# Patient Record
Sex: Male | Born: 1960 | Race: White | Hispanic: No | Marital: Married | Smoking: Never smoker
Health system: Southern US, Community
[De-identification: ages and names within clinical notes are randomized; demographics above are authoritative.]

## PROBLEM LIST (undated history)

## (undated) DIAGNOSIS — N200 Calculus of kidney: Secondary | ICD-10-CM

## (undated) DIAGNOSIS — I341 Nonrheumatic mitral (valve) prolapse: Secondary | ICD-10-CM

## (undated) DIAGNOSIS — K5790 Diverticulosis of intestine, part unspecified, without perforation or abscess without bleeding: Secondary | ICD-10-CM

## (undated) DIAGNOSIS — F419 Anxiety disorder, unspecified: Secondary | ICD-10-CM

## (undated) DIAGNOSIS — I10 Essential (primary) hypertension: Secondary | ICD-10-CM

## (undated) HISTORY — DX: Anxiety disorder, unspecified: F41.9

## (undated) HISTORY — DX: Essential (primary) hypertension: I10

## (undated) HISTORY — PX: COLONOSCOPY: SHX174

## (undated) HISTORY — DX: Diverticulosis of intestine, part unspecified, without perforation or abscess without bleeding: K57.90

---

## 1996-01-25 HISTORY — PX: KNEE ARTHROSCOPY: SUR90

## 1997-08-17 ENCOUNTER — Emergency Department (HOSPITAL_COMMUNITY): Admission: EM | Admit: 1997-08-17 | Discharge: 1997-08-17 | Payer: Self-pay | Admitting: Emergency Medicine

## 1997-08-19 ENCOUNTER — Emergency Department (HOSPITAL_COMMUNITY): Admission: EM | Admit: 1997-08-19 | Discharge: 1997-08-19 | Payer: Self-pay | Admitting: Emergency Medicine

## 1998-03-02 ENCOUNTER — Observation Stay (HOSPITAL_COMMUNITY): Admission: EM | Admit: 1998-03-02 | Discharge: 1998-03-03 | Payer: Self-pay | Admitting: Emergency Medicine

## 1998-03-02 ENCOUNTER — Encounter: Payer: Self-pay | Admitting: General Surgery

## 1999-07-01 ENCOUNTER — Ambulatory Visit (HOSPITAL_COMMUNITY): Admission: RE | Admit: 1999-07-01 | Discharge: 1999-07-01 | Payer: Self-pay | Admitting: *Deleted

## 2001-10-23 ENCOUNTER — Ambulatory Visit (HOSPITAL_BASED_OUTPATIENT_CLINIC_OR_DEPARTMENT_OTHER): Admission: RE | Admit: 2001-10-23 | Discharge: 2001-10-23 | Payer: Self-pay | Admitting: Orthopaedic Surgery

## 2003-05-23 ENCOUNTER — Emergency Department (HOSPITAL_COMMUNITY): Admission: EM | Admit: 2003-05-23 | Discharge: 2003-05-23 | Payer: Self-pay | Admitting: Emergency Medicine

## 2003-11-19 ENCOUNTER — Encounter: Admission: RE | Admit: 2003-11-19 | Discharge: 2003-11-19 | Payer: Self-pay | Admitting: Cardiology

## 2004-03-03 ENCOUNTER — Encounter: Admission: RE | Admit: 2004-03-03 | Discharge: 2004-03-03 | Payer: Self-pay | Admitting: Cardiology

## 2005-04-24 ENCOUNTER — Emergency Department (HOSPITAL_COMMUNITY): Admission: EM | Admit: 2005-04-24 | Discharge: 2005-04-24 | Payer: Self-pay | Admitting: Emergency Medicine

## 2006-06-02 ENCOUNTER — Emergency Department: Payer: Self-pay | Admitting: Emergency Medicine

## 2007-04-02 ENCOUNTER — Emergency Department (HOSPITAL_COMMUNITY): Admission: EM | Admit: 2007-04-02 | Discharge: 2007-04-02 | Payer: Self-pay | Admitting: Emergency Medicine

## 2007-04-08 ENCOUNTER — Observation Stay (HOSPITAL_COMMUNITY): Admission: EM | Admit: 2007-04-08 | Discharge: 2007-04-09 | Payer: Self-pay | Admitting: Emergency Medicine

## 2007-04-12 ENCOUNTER — Emergency Department (HOSPITAL_COMMUNITY): Admission: EM | Admit: 2007-04-12 | Discharge: 2007-04-12 | Payer: Self-pay | Admitting: Emergency Medicine

## 2007-06-30 ENCOUNTER — Emergency Department (HOSPITAL_COMMUNITY): Admission: EM | Admit: 2007-06-30 | Discharge: 2007-06-30 | Payer: Self-pay | Admitting: Emergency Medicine

## 2009-03-23 ENCOUNTER — Emergency Department (HOSPITAL_COMMUNITY): Admission: EM | Admit: 2009-03-23 | Discharge: 2009-03-23 | Payer: Self-pay | Admitting: Emergency Medicine

## 2010-04-14 LAB — URINALYSIS, ROUTINE W REFLEX MICROSCOPIC
Leukocytes, UA: NEGATIVE
Nitrite: NEGATIVE
pH: 5.5 (ref 5.0–8.0)

## 2010-04-14 LAB — URINE MICROSCOPIC-ADD ON

## 2010-06-08 NOTE — H&P (Signed)
NAME:  Matthew Grant, Matthew Grant              ACCOUNT NO.:  [id number]   MEDICAL RECORD NO.:  [id number]          PATIENT TYPE:  OBV   LOCATION:  1536                         FACILITY:  [facility]   PHYSICIAN:  Sigmund I. 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  The patient passed his  ureteral calculus, and saw Dr. Wanda Plump in the office on Wednesday,  with conservative therapy for his right ureteral stone.   The patient awoke this morning at 2:00 a.m. with nausea, vomiting, right  lower quadrant pain, and right flank pain, and right testicular pain.  He had no gross hematuria.  No fever, no chills.  He was seen in [facility] emergency room today, for continued ureteral colic, with CT showing  a 3-mm right UV junction stone.  He also has hydronephrosis.   FAMILY HISTORY:  Noncontributory.   SOCIAL HISTORY:  The patient lives at home with his wife.  He has one  son.   ALLERGIES:  DRUG ALLERGIES ARE NONE KNOWN.   MEDICATIONS:  None.   SOCIAL HISTORY:  Tobacco none.  Alcohol none.   CONSTITUTIONAL/REVIEW OF SYSTEMS:  Significant for general malaise, as  well as acute onset of nausea, vomiting, right lower quadrant pain as  noted above.   ADMISSION PHYSICAL EXAM:  VITAL SIGNS:  Shows a well-developed, well-  nourished white male in mild distress (post pain medication) in the  emergency room.  Blood pressure 139/89, respiratory rate 18, pulse of  66, temperature 99.3.  NECK:  Supple, nontender, no nodes.  CHEST:  Clear to P&A.  ABDOMEN:  Soft, decreased bowel sounds without organomegaly or masses.  There is pain in the right lower quadrant to palpation, and pain in the  right flank to percussion and palpation.   Has pain in the right  testicle.  The GU examination shows normal circumcised penis.  The glans  is normal.  The meatus is normal.  The testicles are descended  bilaterally.  The right and left testicle both measured  4 x 4 cm and nontender.  The epididymis is normal, and the mass is  normal bilaterally.  There is no evidence of infection.  The scrotum  itself is normal.  EXTREMITIES:  No cyanosis, no edema.  PSYCHOLOGIC:  Normal orientation to time, person, and place.   IMPRESSION:  A 3-mm right UV junction stone.  The patient is tired, and  exhausted from trying to pass a stone.  He is in agreement to a 23-hour  observation to see if he can pass a stone overnight.  He may need basket  extraction by Dr. Wanda Plump tomorrow.      Sigmund I. Patsi Sears, M.D.  Electronically Signed     SIT/MEDQ  D:  04/08/2007  T:  04/08/2007  Job:  [id number]   cc:   [place] Service, M.D.  Fax: [phone]   Oley Balm. Georgina Pillion, M.D.  Fax: [phone]

## 2010-06-08 NOTE — Op Note (Signed)
NAME:  Matthew Grant, Matthew Grant              ACCOUNT NO.:  [id number]   MEDICAL RECORD NO.:  [id number]          PATIENT TYPE:  OBV   LOCATION:  1536                         FACILITY:  [facility]   PHYSICIAN:  Boston Service, M.D.DATE OF BIRTH:  04-10-1960   DATE OF PROCEDURE:  04/08/2007  DATE OF DISCHARGE:                               OPERATIVE REPORT   PREOPERATIVE DIAGNOSIS:  A 50 year old male, intractable right  costovertebral angle tenderness, 3-mm stone right UVJ.  All attempts at  conservative therapy insufficient.   POSTOPERATIVE DIAGNOSIS:  A 50 year old male, intractable right  costovertebral angle tenderness, 3-mm stone right UVJ.  All attempts at  conservative therapy insufficient.   PROCEDURE:  Cystoscopy, right and left retrogrades, right ureteroscopy,  stone manipulation.   ANESTHESIA:  General.   DRAINS:  None.   COMPLICATIONS:  None.   DESCRIPTION OF PROCEDURE:  The patient was prepped and draped in the  dorsal lithotomy position after institution of an adequate level of  general anesthesia.  Well-lubricated panendoscope gently inserted at the  urethral meatus.  Normal urethra and sphincter, nonobstructive prostate.  Bullous edema of RUO.  Normal anatomy of LUO.   Retrogrades were performed with a blocking catheter.  Normal course and  caliber of the ureter, pelvis, and calyces on the left.  Hydroureter on  the right with a 3-mm filling defect.  Guidewire was negotiated beyond  the stone.  Ureteral catheter was used for dilation.  Ureteroscope was  inserted alongside the guidewire.  Stone was easily identified and  negotiated into the flat wire basket and then gently withdrawn.  Ureteroscope was reinserted.  No additional stony fragments identified  within the ureter.  Retrograde showed small air bubble in the proximal  ureter but otherwise normal anatomy.  Ureteral catheter was withdrawn.  Bladder was drained.  The patient was given Toradol and B&O suppository  and  then returned to recovery in satisfactory condition.           ______________________________  [place] Service, M.D.     RH/MEDQ  D:  04/09/2007  T:  04/09/2007  Job:  [id number]   cc:   Oley Balm. Georgina Pillion, M.D.  Fax: [phone]

## 2010-06-11 NOTE — Op Note (Signed)
NAME:  ARAS, ALBARRAN                        ACCOUNT NO.:  [id number]   MEDICAL RECORD NO.:  [id number]                   PATIENT TYPE:  AMB   LOCATION:  DSC                                  FACILITY:  [facility]   PHYSICIAN:  Lubertha Basque. Jerl Santos, M.D.             DATE OF BIRTH:  1960-08-09   DATE OF PROCEDURE:  10/23/2001  DATE OF DISCHARGE:                                 OPERATIVE REPORT   PREOPERATIVE DIAGNOSIS:  Left knee torn medial meniscus.   POSTOPERATIVE DIAGNOSES:  1. Left knee torn medial meniscus.  2. Left knee chondromalacia of the patella.   PROCEDURES:  1. Left knee partial medial meniscectomy.  2. Left knee chondroplasty of patellofemoral joint.   ANESTHESIA:  Knee block, MAC.   SURGEON:  Lubertha Basque. Jerl Santos, M.D.   ASSISTANT:  Prince Rome, P.A.   INDICATION FOR PROCEDURE:  The patient is a 50 year old man with seven or  eight months of left knee pain and swelling.  This has persisted despite  oral anti-inflammatories and activity restriction.  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  The procedure was discussed with the patient,  and informed operative consent was obtained after discussion of the possible  complications of, reaction to anesthesia, and infection.   DESCRIPTION OF PROCEDURE:  The patient was taken to the operating suite,  where a knee block was applied along with MAC.  He was positioned supine and  prepped and draped in the normal sterile fashion.  After the administration  of preop IV antibiotics, an arthroscopy of the left knee was performed  through two inferior portals.  The suprapatellar pouch was benign, while the  patellofemoral joint exhibited some mild grade 3 change on the medial  patellar facet, far medial.  A thorough chondroplasty was done, but the  kneecap tracked very well.  In the medial compartment he did have a flap  tear of the posterior horn  of the medial meniscus, addressed with about a  20% partial medial meniscectomy.  He had no degenerative changes in the  compartment.  The lateral compartment was completely benign with no evidence  of meniscal or articular cartilage injury.  He had an intact ACL and PCL.  The knee was thoroughly irrigated at the end of the case, followed by  placement of Marcaine with epinephrine and morphine.  Adaptic was placed  over the two portals, followed by dry gauze and a loose Ace wrap.  Estimated  blood loss and intraoperative fluids can be obtained from anesthesia  records.   DISPOSITION:  The patient was taken to the recovery room in stable  condition.  Plans were for him to go home the same day and to follow up in  the office in less than a week.  I will contact him by phone tonight.  Lubertha Basque Jerl Santos, M.D.   PGD/MEDQ  D:  10/23/2001  T:  10/23/2001  Job:  [id number]

## 2010-06-11 NOTE — Cardiovascular Report (Signed)
[place]  Patient:    Matthew Grant, Matthew Grant                     MRN: [id number] Proc. Date: 07/01/99 Adm. Date:  [id number] Disc. Date: [id number] Attending:  Meade Maw A                        Cardiac Catheterization  INDICATIONS FOR PROCEDURE:  Continual chest pain with redistribution in the inferior apical region on a Cardiolite.  REFERRING PHYSICIAN:  Oley Balm. Georgina Pillion, M.D.  DESCRIPTION OF PROCEDURE:  After obtaining written informed consent, the patient was brought to the cardiac catheterization lab in the postabsorptive state.  Preop sedation was achieved with IV Versed.  The right femoral head was identified using radiographic technique.  Local anesthesia was achieved using 1% Xylocaine.  A 6 French hemostasis sheath was placed into the right femoral artery using a modified Seldinger technique.  Selective coronary angiography was performed using a JL4, JR4 Judkins catheter.  All catheter exchanges were made over a guidewire.  The hemostasis sheath was flushed after each injection.  FINDINGS:  The aorta pressure was 112/62, LV pressure was 112/14.   Single plane ventriculogram revealed normal wall motion with an ejection fraction of 65%.  There was no mitral regurgitation noted.  CORONARY ANGIOGRAPHY:  Left main coronary artery:  The left main coronary artery bifurcated into the left anterior descending and circumflex vessel. There is no significant disease in the left main coronary artery.  Left anterior descending:  The left anterior descending gave rise to a small diagonal #1, moderate sized diagonal #2, diagonal #3 and ended as an apical recurrent branch.  There is no significant disease in the left anterior descending.  Circumflex vessel:  The circumflex was codominant for the posterior circumflex and gave rise to a small OM-1, large bifurcating OM-2 and ended as a PDA AV groove vessel.  There was no significant disease in the  circumflex or its branches.  Right coronary artery:  The right coronary artery was codominant and gave rise to the PDA and PL branch as well as the conus branch.  There was no significant disease in the right coronary artery.  IMPRESSION: 1. Normal coronaries. 2. Normal systolic function.  RECOMMENDATIONS:  To consider other etiologies for his chest pain. DD:  07/01/99 TD:  07/05/99 Job: [id number]

## 2010-10-18 LAB — BASIC METABOLIC PANEL
BUN: 11
CO2: 28
Calcium: 8.7
Creatinine, Ser: 1.65 — ABNORMAL HIGH
GFR calc Af Amer: >60
GFR calc Af Amer: >60
GFR calc non Af Amer: >60
GFR calc non Af Amer: 45 — ABNORMAL LOW
Glucose, Bld: 96
Potassium: 4
Sodium: 141
Sodium: 136
Sodium: 136

## 2010-10-18 LAB — CBC
HCT: 42.5
HCT: 37.4 — ABNORMAL LOW
HCT: 40.1
Hemoglobin: 14.1
MCHC: 34.8
MCHC: 35
MCV: 82.9
RBC: 4.51
RBC: 4.85
RDW: 14
RDW: 14.2
WBC: 6.6
WBC: 8.4

## 2010-10-18 LAB — DIFFERENTIAL
Basophils Absolute: 0
Basophils Relative: 1
Eosinophils Relative: 1
Eosinophils Relative: 1
Lymphocytes Relative: 32
Lymphocytes Relative: 16
Lymphs Abs: 1.4
Monocytes Absolute: 0.6
Neutro Abs: 3.7

## 2010-10-18 LAB — URINE MICROSCOPIC-ADD ON

## 2010-10-18 LAB — URINALYSIS, ROUTINE W REFLEX MICROSCOPIC
Bilirubin Urine: NEGATIVE
Glucose, UA: NEGATIVE
Ketones, ur: NEGATIVE
Leukocytes, UA: NEGATIVE
Leukocytes, UA: NEGATIVE
Leukocytes, UA: NEGATIVE
Specific Gravity, Urine: 1.021
Specific Gravity, Urine: 1.035 — ABNORMAL HIGH
Urobilinogen, UA: 0.2
pH: 5
pH: 8

## 2010-10-21 LAB — URINE MICROSCOPIC-ADD ON

## 2010-10-21 LAB — BASIC METABOLIC PANEL
CO2: 25
GFR calc Af Amer: >60
GFR calc non Af Amer: >60
Glucose, Bld: 103 — ABNORMAL HIGH
Potassium: 3.7
Sodium: 140

## 2010-10-21 LAB — URINALYSIS, ROUTINE W REFLEX MICROSCOPIC
Glucose, UA: NEGATIVE
Ketones, ur: NEGATIVE
Nitrite: NEGATIVE
Specific Gravity, Urine: 1.035 — ABNORMAL HIGH
pH: 5.5

## 2010-10-21 LAB — DIFFERENTIAL
Basophils Absolute: 0
Eosinophils Relative: 2
Lymphocytes Relative: 33
Monocytes Absolute: 0.3
Monocytes Relative: 6

## 2010-10-21 LAB — CBC
HCT: 41
Hemoglobin: 14.1
RBC: 4.88
RDW: 13.9

## 2010-10-21 LAB — URINE CULTURE: Culture: NO GROWTH

## 2011-04-09 ENCOUNTER — Emergency Department (INDEPENDENT_AMBULATORY_CARE_PROVIDER_SITE_OTHER)
Admission: EM | Admit: 2011-04-09 | Discharge: 2011-04-09 | Disposition: A | Discharge status: Home / Self Care | Payer: 59 | Source: Home / Self Care | Attending: Family Medicine | Admitting: Family Medicine

## 2011-04-09 ENCOUNTER — Encounter (HOSPITAL_COMMUNITY): Payer: Self-pay | Admitting: Emergency Medicine

## 2011-04-09 DIAGNOSIS — K6281 Anal sphincter tear (healed) (nontraumatic) (old): Secondary | ICD-10-CM

## 2011-04-09 DIAGNOSIS — S31831A Laceration without foreign body of anus, initial encounter: Secondary | ICD-10-CM

## 2011-04-09 HISTORY — DX: Nonrheumatic mitral (valve) prolapse: I34.1

## 2011-04-09 HISTORY — DX: Calculus of kidney: N20.0

## 2011-04-09 MED ORDER — STARCH 51 % RE SUPP: 1.0000 | Freq: Two times a day (BID) | RECTAL | Status: AC

## 2011-04-09 NOTE — ED Notes (Signed)
Onset one month ago of blood in stool.  Noticed blood on paper, now seeing blood in toilet.

## 2011-04-09 NOTE — ED Provider Notes (Signed)
History     CSN: [id number]  Arrival date & time 04/09/11  1126   First MD Initiated Contact with Patient 04/09/11 1132      Chief Complaint  Patient presents with  . Rectal Bleeding    (Consider location/radiation/quality/duration/timing/severity/associated sxs/prior treatment) Patient is a 51 y.o. male presenting with hematochezia. The history is provided by the patient.  Rectal Bleeding  The current episode started more than 2 weeks ago. The onset was gradual. The problem has been gradually worsening. The pain is mild. The stool is described as streaked with blood. There was no prior successful therapy. There was no prior unsuccessful therapy. Associated symptoms include rectal pain. Pertinent negatives include no abdominal pain, no diarrhea, no hemorrhoids, no nausea and no vomiting. Associated symptoms comments: Anxious b/o fam hx colon and prostate cancer..    Past Medical History  Diagnosis Date  . Mitral valve prolapse   . Kidney calculi     History reviewed. No pertinent past surgical history.  Family History  Problem Relation Age of Onset  . Cancer Mother   . Hypertension Mother   . Cancer Father   . Hypertension Father     History  Substance Use Topics  . Smoking status: Never Smoker   . Smokeless tobacco: Not on file  . Alcohol Use: No      Review of Systems  Constitutional: Negative.   Gastrointestinal: Positive for blood in stool, hematochezia, anal bleeding and rectal pain. Negative for nausea, vomiting, abdominal pain, diarrhea, constipation and hemorrhoids.    Allergies  Iohexol  Home Medications  No current outpatient prescriptions on file.  BP 198/118  Pulse 71  Temp(Src) 98.4 F (36.9 C) (Oral)  Resp 18  SpO2 97%  Physical Exam  Nursing note and vitals reviewed. Constitutional: He is oriented to person, place, and time. He appears well-developed and well-nourished.  Abdominal: Soft. Bowel sounds are normal. He exhibits no  distension and no mass. There is no tenderness. There is no rebound and no guarding.       Superficial inferior anal tear , acutely bleeding, sl tender to palpation, no hemorrhoids., prostate neg.  Neurological: He is alert and oriented to person, place, and time.    ED Course  Procedures (including critical care time)  Labs Reviewed - No data to display No results found.   1. Anal tear       MDM          Linna Hoff, MD 04/09/11 [phone]

## 2011-04-09 NOTE — Discharge Instructions (Signed)
Use medicine as prescribed and see dr Arlyce Dice for colonoscopy.

## 2011-08-19 ENCOUNTER — Encounter: Payer: Self-pay | Admitting: Internal Medicine

## 2011-09-20 ENCOUNTER — Encounter: Payer: Self-pay | Admitting: Internal Medicine

## 2011-10-07 ENCOUNTER — Ambulatory Visit (AMBULATORY_SURGERY_CENTER): Payer: 59 | Admitting: *Deleted

## 2011-10-07 VITALS — Ht 76.0 in | Wt 259.0 lb

## 2011-10-07 DIAGNOSIS — Z1211 Encounter for screening for malignant neoplasm of colon: Secondary | ICD-10-CM

## 2011-10-07 MED ORDER — NA SULFATE-K SULFATE-MG SULF 17.5-3.13-1.6 GM/177ML PO SOLN: ORAL | Status: DC

## 2011-10-10 ENCOUNTER — Encounter: Payer: Self-pay | Admitting: Internal Medicine

## 2011-10-14 ENCOUNTER — Encounter: Payer: Self-pay | Admitting: Internal Medicine

## 2011-10-14 ENCOUNTER — Ambulatory Visit (AMBULATORY_SURGERY_CENTER): Payer: 59 | Admitting: Internal Medicine

## 2011-10-14 VITALS — BP 141/91 | HR 65 | Temp 97.6°F | Resp 17 | Ht 76.0 in | Wt 259.0 lb

## 2011-10-14 DIAGNOSIS — D126 Benign neoplasm of colon, unspecified: Secondary | ICD-10-CM

## 2011-10-14 DIAGNOSIS — Z1211 Encounter for screening for malignant neoplasm of colon: Secondary | ICD-10-CM

## 2011-10-14 DIAGNOSIS — K635 Polyp of colon: Secondary | ICD-10-CM

## 2011-10-14 LAB — HM COLONOSCOPY

## 2011-10-14 MED ORDER — SODIUM CHLORIDE 0.9 % IV SOLN: 500.0000 mL | INTRAVENOUS | Status: DC

## 2011-10-14 NOTE — Patient Instructions (Addendum)
YOU HAD AN ENDOSCOPIC PROCEDURE TODAY AT THE [facility]: Refer to the procedure report that was given to you for any specific questions about what was found during the examination.  If the procedure report does not answer your questions, please call your gastroenterologist to clarify.  If you requested that your care partner not be given the details of your procedure findings, then the procedure report has been included in a sealed envelope for you to review at your convenience later.  YOU SHOULD EXPECT: Some feelings of bloating in the abdomen. Passage of more gas than usual.  Walking can help get rid of the air that was put into your GI tract during the procedure and reduce the bloating. If you had a lower endoscopy (such as a colonoscopy or flexible sigmoidoscopy) you may notice spotting of blood in your stool or on the toilet paper. If you underwent a bowel prep for your procedure, then you may not have a normal bowel movement for a few days.  DIET: Your first meal following the procedure should be a light meal and then it is ok to progress to your normal diet.  A half-sandwich or bowl of soup is an example of a good first meal.  Heavy or fried foods are harder to digest and may make you feel nauseous or bloated.  Likewise meals heavy in dairy and vegetables can cause extra gas to form and this can also increase the bloating.  Drink plenty of fluids but you should avoid alcoholic beverages for 24 hours.  ACTIVITY: Your care partner should take you home directly after the procedure.  You should plan to take it easy, moving slowly for the rest of the day.  You can resume normal activity the day after the procedure however you should NOT DRIVE or use heavy machinery for 24 hours (because of the sedation medicines used during the test).    SYMPTOMS TO REPORT IMMEDIATELY: A gastroenterologist can be reached at any hour.  During normal business hours, 8:30 AM to 5:00 PM Monday through Friday,  call [phone].  After hours and on weekends, please call the GI answering service at [phone] who will take a message and have the physician on call contact you.   Following lower endoscopy (colonoscopy or flexible sigmoidoscopy):  Excessive amounts of blood in the stool  Significant tenderness or worsening of abdominal pains                                      Do not use aspirin nor                                                                                                                                               NSAIDS for two weeks.  Swelling  of the abdomen that is new, acute  Fever of 100F or higher  FOLLOW UP: If any biopsies were taken you will be contacted by phone or by letter within the next 1-3 weeks.  Call your gastroenterologist if you have not heard about the biopsies in 3 weeks.  Our staff will call the home number listed on your records the next business day following your procedure to check on you and address any questions or concerns that you may have at that time regarding the information given to you following your procedure. This is a courtesy call and so if there is no answer at the home number and we have not heard from you through the emergency physician on call, we will assume that you have returned to your regular daily activities without incident.  SIGNATURES/CONFIDENTIALITY: You and/or your care partner have signed paperwork which will be entered into your electronic medical record.  These signatures attest to the fact that that the information above on your After Visit Summary has been reviewed and is understood.  Full responsibility of the confidentiality of this discharge information lies with you and/or your care-partner.   Thank-you for choosing [place] for your healthcare needs.

## 2011-10-14 NOTE — Op Note (Signed)
[facility] [address] [place], [address]   COLONOSCOPY PROCEDURE REPORT  PATIENT: Matthew, Grant  MR#: [id number] BIRTHDATE: [birth year] , 51  yrs. old GENDER: Male ENDOSCOPIST: Beverley Fiedler, MD REFERRED XB:JYNWG, Bernadene Bell. PROCEDURE DATE:  10/14/2011 PROCEDURE:   Colonoscopy with snare polypectomy ASA CLASS:   Class II INDICATIONS:average risk screening and first colonoscopy. MEDICATIONS: MAC sedation, administered by CRNA and Propofol (Diprivan) 340 mg IV  DESCRIPTION OF PROCEDURE:   After the risks benefits and alternatives of the procedure were thoroughly explained, informed consent was obtained.  A digital rectal exam revealed no rectal mass.   The LB CF-[id number]  endoscope was introduced through the anus and advanced to the cecum, which was identified by both the appendix and ileocecal valve. No adverse events experienced. The quality of the prep was Suprep good  The instrument was then slowly withdrawn as the colon was fully examined.   COLON FINDINGS: Mild diverticulosis was noted in the descending colon and sigmoid colon.   Three sessile polyps measuring 5-12 mm in size were found in the descending colon (6 mm), sigmoid colon (5 mm), and rectosigmoid colon (12 mm).  Polypectomy was performed using hot snare (1) and using cold snare (2).  All resections were complete and all polyp tissue was completely retrieved. Retroflexed views revealed internal hemorrhoids. The time to cecum=10 minutes 33 seconds.  Withdrawal time=14 minutes 51 seconds.  The scope was withdrawn and the procedure completed. COMPLICATIONS: There were no complications.  ENDOSCOPIC IMPRESSION: 1.   Mild diverticulosis was noted in the descending colon and sigmoid colon 2.   Three sessile polyps measuring 5-12 mm in size were found in the descending colon, sigmoid colon, and rectosigmoid colon; Polypectomy was performed using hot snare and using cold  snare  RECOMMENDATIONS: 1.  Hold aspirin, aspirin products, and anti-inflammatory medication for 2 weeks. 2.  High fiber diet 3.  If the polyps removed today are proven to be adenomatous (pre-cancerous) polyps, you will need a colonoscopy in 3 years. Otherwise you should continue to follow colorectal cancer screening guidelines for "routine risk" patients with a colonoscopy in 10 years.  You will receive a letter within 1-2 weeks with the results of your biopsy as well as final recommendations.  Please call my office if you have not received a letter after 3 weeks.  eSigned:  Beverley Fiedler, MD 10/14/2011 10:09 AM  cc: Etta Grandchild, MD and The Patient   PATIENT NAME:  Matthew, Grant MR#: [id number]

## 2011-10-14 NOTE — Progress Notes (Addendum)
Patient did not have preoperative order for IV antibiotic SSI prophylaxis. (G8918)  Patient did not experience any of the following events: a burn prior to discharge; a fall within the facility; wrong site/side/patient/procedure/implant event; or a hospital transfer or hospital admission upon discharge from the facility. (G8907)  

## 2011-10-17 ENCOUNTER — Telehealth: Payer: Self-pay | Admitting: *Deleted

## 2011-10-17 NOTE — Telephone Encounter (Signed)
NO ANSWER, MESSAGE LEFT FOR THE PATIENT. 

## 2011-10-18 ENCOUNTER — Encounter: Payer: Self-pay | Admitting: Internal Medicine

## 2011-10-31 ENCOUNTER — Encounter: Payer: Self-pay | Admitting: Internal Medicine

## 2011-10-31 ENCOUNTER — Other Ambulatory Visit (INDEPENDENT_AMBULATORY_CARE_PROVIDER_SITE_OTHER): Payer: 59

## 2011-10-31 ENCOUNTER — Ambulatory Visit (INDEPENDENT_AMBULATORY_CARE_PROVIDER_SITE_OTHER): Payer: 59 | Admitting: Internal Medicine

## 2011-10-31 VITALS — BP 118/88 | HR 59 | Temp 98.3°F | Resp 16 | Ht 76.0 in | Wt 256.2 lb

## 2011-10-31 DIAGNOSIS — Z0000 Encounter for general adult medical examination without abnormal findings: Secondary | ICD-10-CM

## 2011-10-31 DIAGNOSIS — Z23 Encounter for immunization: Secondary | ICD-10-CM

## 2011-10-31 LAB — CBC WITH DIFFERENTIAL/PLATELET
Basophils Absolute: 0.1 10*3/uL (ref 0.0–0.1)
Eosinophils Absolute: 0.2 10*3/uL (ref 0.0–0.7)
Hemoglobin: 14.8 g/dL (ref 13.0–17.0)
Lymphocytes Relative: 30.7 % (ref 12.0–46.0)
Lymphs Abs: 2 10*3/uL (ref 0.7–4.0)
MCHC: 33.8 g/dL (ref 30.0–36.0)
Monocytes Relative: 5.2 % (ref 3.0–12.0)
Neutro Abs: 3.9 10*3/uL (ref 1.4–7.7)
Platelets: 223 10*3/uL (ref 150.0–400.0)
RDW: 14.3 % (ref 11.5–14.6)

## 2011-10-31 LAB — COMPREHENSIVE METABOLIC PANEL
ALT: 26 U/L (ref 0–53)
AST: 16 U/L (ref 0–37)
CO2: 30 mEq/L (ref 19–32)
Calcium: 8.9 mg/dL (ref 8.4–10.5)
Chloride: 106 mEq/L (ref 96–112)
Creatinine, Ser: 0.9 mg/dL (ref 0.4–1.5)
GFR: 93.61 mL/min (ref >60.00)
Potassium: 4.6 mEq/L (ref 3.5–5.1)
Sodium: 141 mEq/L (ref 135–145)
Total Protein: 6.6 g/dL (ref 6.0–8.3)

## 2011-10-31 LAB — LIPID PANEL
LDL Cholesterol: 122 mg/dL — ABNORMAL HIGH (ref 0–99)
Total CHOL/HDL Ratio: 4

## 2011-10-31 LAB — TSH: TSH: 0.64 u[IU]/mL (ref 0.35–5.50)

## 2011-10-31 NOTE — Assessment & Plan Note (Signed)
Exam done, vaccines were addressed, labs were ordered, pt ed material was given

## 2011-10-31 NOTE — Progress Notes (Signed)
  Subjective:    Patient ID: Matthew Grant, male    DOB: November 21, 1960, 51 y.o.   MRN: [id number]  HPI  New to me for a physical, he feels well and offers no complaints.  Review of Systems  Constitutional: Negative.   HENT: Negative.   Eyes: Negative.   Respiratory: Negative.   Cardiovascular: Negative.   Gastrointestinal: Negative.   Genitourinary: Negative.   Musculoskeletal: Negative.   Skin: Negative.   Neurological: Negative.   Hematological: Negative.   Psychiatric/Behavioral: Negative.        Objective:   Physical Exam  Vitals reviewed. Constitutional: He is oriented to person, place, and time. He appears well-developed and well-nourished. No distress.  HENT:  Head: Normocephalic and atraumatic.  Mouth/Throat: Oropharynx is clear and moist. No oropharyngeal exudate.  Eyes: Conjunctivae normal are normal. Right eye exhibits no discharge. Left eye exhibits no discharge. No scleral icterus.  Neck: Normal range of motion. Neck supple. No JVD present. No tracheal deviation present. No thyromegaly present.  Cardiovascular: Normal rate, regular rhythm, normal heart sounds and intact distal pulses.  Exam reveals no gallop and no friction rub.   No murmur heard. Pulmonary/Chest: Effort normal and breath sounds normal. No stridor. No respiratory distress. He has no wheezes. He has no rales. He exhibits no tenderness.  Abdominal: Soft. Bowel sounds are normal. He exhibits no distension and no mass. There is no tenderness. There is no rebound and no guarding. Hernia confirmed negative in the right inguinal area and confirmed negative in the left inguinal area.  Genitourinary: Rectum normal, prostate normal, testes normal and penis normal. Rectal exam shows no external hemorrhoid, no internal hemorrhoid, no fissure, no mass, no tenderness and anal tone normal. Guaiac negative stool. Prostate is not enlarged and not tender. Right testis shows no mass, no swelling and no tenderness. Right  testis is descended. Left testis shows no mass, no swelling and no tenderness. Left testis is descended. Circumcised. No penile erythema or penile tenderness. No discharge found.  Musculoskeletal: Normal range of motion. He exhibits no edema and no tenderness.  Lymphadenopathy:    He has no cervical adenopathy.       Right: No inguinal adenopathy present.       Left: No inguinal adenopathy present.  Neurological: He is oriented to person, place, and time.  Skin: Skin is warm and dry. No rash noted. He is not diaphoretic. No erythema. No pallor.  Psychiatric: He has a normal mood and affect. His behavior is normal. Judgment and thought content normal.      Lab Results  Component Value Date   WBC 5.6 06/30/2007   HGB 14.1 06/30/2007   HCT 41.0 06/30/2007   PLT 191 06/30/2007   GLUCOSE 103* 06/30/2007   NA 140 06/30/2007   K 3.7 06/30/2007   CL 105 06/30/2007   CREATININE 0.99 06/30/2007   BUN 16 06/30/2007   CO2 25 06/30/2007      Assessment & Plan:

## 2011-10-31 NOTE — Patient Instructions (Signed)
Health Maintenance, Males A healthy lifestyle and preventative care can promote health and wellness.  Maintain regular health, dental, and eye exams.  Eat a healthy diet. Foods like vegetables, fruits, whole grains, low-fat dairy products, and lean protein foods contain the nutrients you need without too many calories. Decrease your intake of foods high in solid fats, added sugars, and salt. Get information about a proper diet from your caregiver, if necessary.  Regular physical exercise is one of the most important things you can do for your health. Most adults should get at least 150 minutes of moderate-intensity exercise (any activity that increases your heart rate and causes you to sweat) each week. In addition, most adults need muscle-strengthening exercises on 2 or more days a week.   Maintain a healthy weight. The body mass index (BMI) is a screening tool to identify possible weight problems. It provides an estimate of body fat based on height and weight. Your caregiver can help determine your BMI, and can help you achieve or maintain a healthy weight. For adults 20 years and older:  A BMI below 18.5 is considered underweight.  A BMI of 18.5 to 24.9 is normal.  A BMI of 25 to 29.9 is considered overweight.  A BMI of 30 and above is considered obese.  Maintain normal blood lipids and cholesterol by exercising and minimizing your intake of saturated fat. Eat a balanced diet with plenty of fruits and vegetables. Blood tests for lipids and cholesterol should begin at age 20 and be repeated every 5 years. If your lipid or cholesterol levels are high, you are over 50, or you are a high risk for heart disease, you may need your cholesterol levels checked more frequently.Ongoing high lipid and cholesterol levels should be treated with medicines, if diet and exercise are not effective.  If you smoke, find out from your caregiver how to quit. If you do not use tobacco, do not start.  If you  choose to drink alcohol, do not exceed 2 drinks per day. One drink is considered to be 12 ounces (355 mL) of beer, 5 ounces (148 mL) of wine, or 1.5 ounces (44 mL) of liquor.  Avoid use of street drugs. Do not share needles with anyone. Ask for help if you need support or instructions about stopping the use of drugs.  High blood pressure causes heart disease and increases the risk of stroke. Blood pressure should be checked at least every 1 to 2 years. Ongoing high blood pressure should be treated with medicines if weight loss and exercise are not effective.  If you are 45 to 50 years old, ask your caregiver if you should take aspirin to prevent heart disease.  Diabetes screening involves taking a blood sample to check your fasting blood sugar level. This should be done once every 3 years, after age 45, if you are within normal weight and without risk factors for diabetes. Testing should be considered at a younger age or be carried out more frequently if you are overweight and have at least 1 risk factor for diabetes.  Colorectal cancer can be detected and often prevented. Most routine colorectal cancer screening begins at the age of 50 and continues through age 75. However, your caregiver may recommend screening at an earlier age if you have risk factors for colon cancer. On a yearly basis, your caregiver may provide home test kits to check for hidden blood in the stool. Use of a small camera at the end of a tube,   to directly examine the colon (sigmoidoscopy or colonoscopy), can detect the earliest forms of colorectal cancer. Talk to your caregiver about this at age 50, when routine screening begins. Direct examination of the colon should be repeated every 5 to 10 years through age 75, unless early forms of pre-cancerous polyps or small growths are found.  Hepatitis C blood testing is recommended for all people born from 1945 through 1965 and any individual with known risks for hepatitis C.  Healthy  men should no longer receive prostate-specific antigen (PSA) blood tests as part of routine cancer screening. Consult with your caregiver about prostate cancer screening.  Testicular cancer screening is not recommended for adolescents or adult males who have no symptoms. Screening includes self-exam, caregiver exam, and other screening tests. Consult with your caregiver about any symptoms you have or any concerns you have about testicular cancer.  Practice safe sex. Use condoms and avoid high-risk sexual practices to reduce the spread of sexually transmitted infections (STIs).  Use sunscreen with a sun protection factor (SPF) of 30 or greater. Apply sunscreen liberally and repeatedly throughout the day. You should seek shade when your shadow is shorter than you. Protect yourself by wearing long sleeves, pants, a wide-brimmed hat, and sunglasses year round, whenever you are outdoors.  Notify your caregiver of new moles or changes in moles, especially if there is a change in shape or color. Also notify your caregiver if a mole is larger than the size of a pencil eraser.  A one-time screening for abdominal aortic aneurysm (AAA) and surgical repair of large AAAs by sound wave imaging (ultrasonography) is recommended for ages 65 to 75 years who are current or former smokers.  Stay current with your immunizations. Document Released: 07/09/2007 Document Revised: 04/04/2011 Document Reviewed: 06/07/2010 [employer] Patient Information 2013 [employer].  

## 2012-05-22 ENCOUNTER — Ambulatory Visit (INDEPENDENT_AMBULATORY_CARE_PROVIDER_SITE_OTHER): Payer: PRIVATE HEALTH INSURANCE | Admitting: Internal Medicine

## 2012-05-22 ENCOUNTER — Encounter: Payer: Self-pay | Admitting: Internal Medicine

## 2012-05-22 ENCOUNTER — Other Ambulatory Visit (INDEPENDENT_AMBULATORY_CARE_PROVIDER_SITE_OTHER): Payer: PRIVATE HEALTH INSURANCE

## 2012-05-22 VITALS — BP 150/100 | HR 50 | Temp 98.0°F | Resp 16 | Ht 76.0 in | Wt 250.2 lb

## 2012-05-22 DIAGNOSIS — I10 Essential (primary) hypertension: Secondary | ICD-10-CM

## 2012-05-22 DIAGNOSIS — R0789 Other chest pain: Secondary | ICD-10-CM

## 2012-05-22 DIAGNOSIS — R9431 Abnormal electrocardiogram [ECG] [EKG]: Secondary | ICD-10-CM

## 2012-05-22 LAB — BASIC METABOLIC PANEL
Calcium: 8.7 mg/dL (ref 8.4–10.5)
Creatinine, Ser: 1 mg/dL (ref 0.4–1.5)
Sodium: 139 mEq/L (ref 135–145)

## 2012-05-22 LAB — TSH: TSH: 0.72 u[IU]/mL (ref 0.35–5.50)

## 2012-05-22 MED ORDER — AMLODIPINE-OLMESARTAN 5-40 MG PO TABS: 1.0000 | ORAL_TABLET | Freq: Every day | ORAL | Status: DC

## 2012-05-22 NOTE — Patient Instructions (Signed)

## 2012-05-22 NOTE — Progress Notes (Signed)
Subjective:    Patient ID: Matthew Grant, male    DOB: [birth year], 52 y.o.   MRN: [id number]  Chest Pain  This is a recurrent problem. Episode onset: off and on for 2 months. The onset quality is gradual. The problem occurs intermittently. The problem has been unchanged. The pain is present in the lateral region. The pain is at a severity of 2/10. The pain is mild. The quality of the pain is described as tightness. The pain does not radiate. Associated symptoms include headaches. Pertinent negatives include no abdominal pain, back pain, claudication, cough, diaphoresis, dizziness, exertional chest pressure, fever, hemoptysis, irregular heartbeat, leg pain, lower extremity edema, malaise/fatigue, nausea, near-syncope, numbness, orthopnea, palpitations, PND, shortness of breath, sputum production, syncope, vomiting or weakness. The pain is aggravated by nothing. He has tried nothing for the symptoms. The treatment provided no relief.  His past medical history is significant for hypertension.  Pertinent negatives for past medical history include no seizures. Prior diagnostic workup includes cardiac catherization (he tells me that he had a normal cardiac cath about 12 years ago).      Review of Systems  Constitutional: Negative.  Negative for fever, chills, malaise/fatigue, diaphoresis, activity change, appetite change, fatigue and unexpected weight change.  Eyes: Negative.   Respiratory: Negative.  Negative for apnea, cough, hemoptysis, sputum production, choking, chest tightness, shortness of breath, wheezing and stridor.   Cardiovascular: Positive for chest pain. Negative for palpitations, orthopnea, claudication, leg swelling, syncope, PND and near-syncope.  Gastrointestinal: Negative.  Negative for nausea, vomiting, abdominal pain, diarrhea and constipation.  Endocrine: Negative.   Genitourinary: Negative.   Musculoskeletal: Negative.  Negative for myalgias, back pain, joint swelling,  arthralgias and gait problem.  Skin: Negative.  Negative for color change, pallor, rash and wound.  Allergic/Immunologic: Negative.   Neurological: Positive for light-headedness and headaches. Negative for dizziness, tremors, seizures, facial asymmetry, speech difficulty, weakness and numbness.  Hematological: Negative.  Negative for adenopathy. Does not bruise/bleed easily.  Psychiatric/Behavioral: Negative.        Objective:   Physical Exam  Vitals reviewed. Constitutional: He is oriented to person, place, and time. He appears well-developed and well-nourished. No distress.  HENT:  Head: Normocephalic and atraumatic.  Mouth/Throat: Oropharynx is clear and moist. No oropharyngeal exudate.  Eyes: Conjunctivae are normal. Right eye exhibits no discharge. Left eye exhibits no discharge. No scleral icterus.  Neck: Normal range of motion. Neck supple. No JVD present. No tracheal deviation present. No thyromegaly present.  Cardiovascular: Normal rate, regular rhythm, normal heart sounds and intact distal pulses.  Exam reveals no gallop and no friction rub.   No murmur heard. Pulmonary/Chest: Effort normal and breath sounds normal. No stridor. No respiratory distress. He has no wheezes. He has no rales. He exhibits no tenderness.  Abdominal: Soft. Bowel sounds are normal. He exhibits no distension and no mass. There is no tenderness. There is no rebound and no guarding.  Musculoskeletal: Normal range of motion. He exhibits no edema and no tenderness.  Lymphadenopathy:    He has no cervical adenopathy.  Neurological: He is oriented to person, place, and time.  Skin: Skin is warm and dry. No rash noted. He is not diaphoretic. No erythema. No pallor.  Psychiatric: He has a normal mood and affect. His behavior is normal. Judgment and thought content normal.      Lab Results  Component Value Date   WBC 6.5 10/31/2011   HGB 14.8 10/31/2011   HCT 43.6 10/31/2011   PLT  223.0 10/31/2011   GLUCOSE  84 10/31/2011   CHOL 179 10/31/2011   TRIG 77.0 10/31/2011   HDL 42.10 10/31/2011   LDLCALC 122* 10/31/2011   ALT 26 10/31/2011   AST 16 10/31/2011   NA 141 10/31/2011   K 4.6 10/31/2011   CL 106 10/31/2011   CREATININE 0.9 10/31/2011   BUN 14 10/31/2011   CO2 30 10/31/2011   TSH 0.64 10/31/2011   PSA 0.85 10/31/2011      Assessment & Plan:

## 2012-05-23 ENCOUNTER — Telehealth: Payer: Self-pay

## 2012-05-23 DIAGNOSIS — R0789 Other chest pain: Secondary | ICD-10-CM

## 2012-05-23 DIAGNOSIS — R9431 Abnormal electrocardiogram [ECG] [EKG]: Secondary | ICD-10-CM

## 2012-05-23 NOTE — Telephone Encounter (Signed)
Pt advised.

## 2012-05-23 NOTE — Telephone Encounter (Signed)
done

## 2012-05-23 NOTE — Assessment & Plan Note (Signed)
EKG has an old abnormality - nothing acute I will check his troponin and cardiac enzymes

## 2012-05-23 NOTE — Telephone Encounter (Signed)
I have no way to order a test there

## 2012-05-23 NOTE — Assessment & Plan Note (Signed)
Will start Azor to treat the hypertension

## 2012-05-23 NOTE — Telephone Encounter (Signed)
Pt called requesting the results of labs

## 2012-05-23 NOTE — Telephone Encounter (Signed)
All the labs were normal

## 2012-05-23 NOTE — Telephone Encounter (Signed)
Pt is now requesting a referral to Cardiology at [facility]

## 2012-05-23 NOTE — Telephone Encounter (Signed)
Pt called requesting to have stress test order changed to [facility] due to cost savings.

## 2012-05-23 NOTE — Assessment & Plan Note (Addendum)
On the EKG there is loss of voltage and flat T waves in III and aVf (?old inferior MI) Today I will check his cardiac enzymes to see if there has been an acute event I have also ordered a lexiscan to see if there is CAD, old scar

## 2012-05-29 ENCOUNTER — Encounter (HOSPITAL_COMMUNITY): Payer: PRIVATE HEALTH INSURANCE

## 2013-01-24 HISTORY — PX: NECK SURGERY: SHX720

## 2014-02-24 HISTORY — PX: KIDNEY STONE SURGERY: SHX686

## 2014-09-22 ENCOUNTER — Encounter: Payer: Self-pay | Admitting: Internal Medicine

## 2014-12-02 ENCOUNTER — Encounter: Payer: Self-pay | Admitting: Internal Medicine

## 2014-12-25 ENCOUNTER — Ambulatory Visit (AMBULATORY_SURGERY_CENTER): Payer: Self-pay

## 2014-12-25 VITALS — Ht 76.0 in | Wt 260.4 lb

## 2014-12-25 DIAGNOSIS — Z860100 Personal history of colon polyps, unspecified: Secondary | ICD-10-CM

## 2014-12-25 MED ORDER — SUPREP BOWEL PREP KIT 17.5-3.13-1.6 GM/177ML PO SOLN: 1.0000 | Freq: Once | ORAL | Status: DC

## 2014-12-25 NOTE — Progress Notes (Signed)
No allergies to eggs or soy No past problems with anesthesia No diet/weight loss meds No home oxygen  Has email and internet;registered for emmi

## 2014-12-30 ENCOUNTER — Encounter: Payer: Self-pay | Admitting: Internal Medicine

## 2015-01-08 ENCOUNTER — Ambulatory Visit (AMBULATORY_SURGERY_CENTER): Payer: PRIVATE HEALTH INSURANCE | Admitting: Internal Medicine

## 2015-01-08 ENCOUNTER — Encounter: Payer: Self-pay | Admitting: Internal Medicine

## 2015-01-08 VITALS — BP 132/67 | HR 54 | Temp 95.9°F | Resp 22 | Ht 76.0 in | Wt 260.0 lb

## 2015-01-08 DIAGNOSIS — K635 Polyp of colon: Secondary | ICD-10-CM | POA: Diagnosis not present

## 2015-01-08 DIAGNOSIS — K5140 Inflammatory polyps of colon without complications: Secondary | ICD-10-CM | POA: Diagnosis not present

## 2015-01-08 DIAGNOSIS — D125 Benign neoplasm of sigmoid colon: Secondary | ICD-10-CM

## 2015-01-08 DIAGNOSIS — Z86010 Personal history of colonic polyps: Secondary | ICD-10-CM

## 2015-01-08 MED ORDER — SODIUM CHLORIDE 0.9 % IV SOLN: 500.0000 mL | INTRAVENOUS | Status: DC

## 2015-01-08 NOTE — Patient Instructions (Signed)
YOU HAD AN ENDOSCOPIC PROCEDURE TODAY AT THE [facility]:   Refer to the procedure report that was given to you for any specific questions about what was found during the examination.  If the procedure report does not answer your questions, please call your gastroenterologist to clarify.  If you requested that your care partner not be given the details of your procedure findings, then the procedure report has been included in a sealed envelope for you to review at your convenience later.  YOU SHOULD EXPECT: Some feelings of bloating in the abdomen. Passage of more gas than usual.  Walking can help get rid of the air that was put into your GI tract during the procedure and reduce the bloating. If you had a lower endoscopy (such as a colonoscopy or flexible sigmoidoscopy) you may notice spotting of blood in your stool or on the toilet paper. If you underwent a bowel prep for your procedure, you may not have a normal bowel movement for a few days.  Please Note:  You might notice some irritation and congestion in your nose or some drainage.  This is from the oxygen used during your procedure.  There is no need for concern and it should clear up in a day or so.  SYMPTOMS TO REPORT IMMEDIATELY:   Following lower endoscopy (colonoscopy or flexible sigmoidoscopy):  Excessive amounts of blood in the stool  Significant tenderness or worsening of abdominal pains  Swelling of the abdomen that is new, acute  Fever of 100F or higher  For urgent or emergent issues, a gastroenterologist can be reached at any hour by calling [phone].  DIET: Your first meal following the procedure should be a small meal and then it is ok to progress to your normal diet. Heavy or fried foods are harder to digest and may make you feel nauseous or bloated.  Likewise, meals heavy in dairy and vegetables can increase bloating.  Drink plenty of fluids but you should avoid alcoholic beverages for 24 hours.  ACTIVITY:   You should plan to take it easy for the rest of today and you should NOT DRIVE or use heavy machinery until tomorrow (because of the sedation medicines used during the test).    FOLLOW UP: Our staff will call the number listed on your records the next business day following your procedure to check on you and address any questions or concerns that you may have regarding the information given to you following your procedure. If we do not reach you, we will leave a message.  However, if you are feeling well and you are not experiencing any problems, there is no need to return our call.  We will assume that you have returned to your regular daily activities without incident.  If any biopsies were taken you will be contacted by phone or by letter within the next 1-3 weeks.  Please call us at [phone] if you have not heard about the biopsies in 3 weeks.    SIGNATURES/CONFIDENTIALITY: You and/or your care partner have signed paperwork which will be entered into your electronic medical record.  These signatures attest to the fact that that the information above on your After Visit Summary has been reviewed and is understood.  Full responsibility of the confidentiality of this discharge information lies with you and/or your care-partner.  Await pathology  Please read over handouts about polyps, diverticulosis and high fiber diets  Continue your normal medications 

## 2015-01-08 NOTE — Progress Notes (Signed)
To recovery, report to [place], RN, VSS 

## 2015-01-08 NOTE — Progress Notes (Signed)
Called to room to assist during endoscopic procedure.  Patient ID and intended procedure confirmed with present staff. Received instructions for my participation in the procedure from the performing physician.  

## 2015-01-08 NOTE — Op Note (Signed)
[facility]  [address] [place], [id number]   COLONOSCOPY PROCEDURE REPORT  PATIENT: Matthew Grant, Matthew Grant  MR#: [id number] BIRTHDATE: 02-Sep-1960 , 71  yrs. old GENDER: male ENDOSCOPIST: Jerene Bears, MD PROCEDURE DATE:  01/08/2015 PROCEDURE:   Colonoscopy, surveillance and Colonoscopy with snare polypectomy First Screening Colonoscopy - Avg.  risk and is 50 yrs.  old or older - No.  Prior Negative Screening - Now for repeat screening. N/A  History of Adenoma - Now for follow-up colonoscopy & has been > or = to 3 yrs.  Yes hx of adenoma.  Has been 3 or more years since last colonoscopy.  Polyps removed today? Yes ASA CLASS:   Class II INDICATIONS:Surveillance due to prior colonic neoplasia and PH Colon Adenoma. MEDICATIONS: Monitored anesthesia care and Propofol 400 mg IV  DESCRIPTION OF PROCEDURE:   After the risks benefits and alternatives of the procedure were thoroughly explained, informed consent was obtained.  The digital rectal exam revealed no rectal mass.   The LB [id number]  endoscope was introduced through the anus and advanced to the cecum, which was identified by both the appendix and ileocecal valve. No adverse events experienced. The quality of the prep was good.  (Suprep was used)  The instrument was then slowly withdrawn as the colon was fully examined. Estimated blood loss is zero unless otherwise noted in this procedure report.   COLON FINDINGS: The colon was redundant.  Manual abdominal counter-pressure (applied very low in the anterior abd) was used to reach the cecum.   A sessile polyp measuring 6 mm in size was found in the sigmoid colon at the edge of a diverticulum.  A polypectomy was performed with a cold snare, then cold forceps used to ensure complete resection.  The resection was complete, the polyp tissue was completely retrieved and sent to histology.   There was moderate diverticulosis noted in the descending colon and  sigmoid colon.  Retroflexed views revealed no abnormalities. The time to cecum = 8.0 Withdrawal time = 14.8   The scope was withdrawn and the procedure completed. COMPLICATIONS: There were no immediate complications.  ENDOSCOPIC IMPRESSION: 1.   Sessile polyp was found in the sigmoid colon; polypectomy was performed with a cold snare 2.   Moderate diverticulosis was noted in the descending colon and sigmoid colon  RECOMMENDATIONS: 1.  Await pathology results 2.  High fiber diet 3.  Timing of repeat colonoscopy will be determined by pathology findings. 4.  You will receive a letter within 1-2 weeks with the results of your biopsy as well as final recommendations.  Please call my office if you have not received a letter after 3 weeks.  eSigned:  Jerene Bears, MD 01/08/2015 10:00 AM cc: Janith Lima, MD and The Patient

## 2015-01-09 ENCOUNTER — Telehealth: Payer: Self-pay | Admitting: *Deleted

## 2015-01-09 NOTE — Telephone Encounter (Signed)
  Follow up Call-  Call back number 01/08/2015  Post procedure Call Back phone  # [phone]  Permission to leave phone message Yes     Patient questions:  Message left to call us if  Necessary.

## 2015-01-12 LAB — HM COLONOSCOPY

## 2015-01-20 ENCOUNTER — Encounter: Payer: Self-pay | Admitting: Internal Medicine

## 2015-01-20 NOTE — Addendum Note (Signed)
Addended by: Janith Lima on: 01/20/2015 01:48 PM   Modules accepted: Miquel Dunn

## 2015-05-13 ENCOUNTER — Ambulatory Visit: Payer: PRIVATE HEALTH INSURANCE | Admitting: Internal Medicine

## 2015-05-14 ENCOUNTER — Other Ambulatory Visit (INDEPENDENT_AMBULATORY_CARE_PROVIDER_SITE_OTHER): Payer: 59

## 2015-05-14 ENCOUNTER — Encounter: Payer: Self-pay | Admitting: Internal Medicine

## 2015-05-14 ENCOUNTER — Ambulatory Visit (INDEPENDENT_AMBULATORY_CARE_PROVIDER_SITE_OTHER): Payer: 59 | Admitting: Internal Medicine

## 2015-05-14 VITALS — BP 124/78 | HR 63 | Temp 98.0°F | Resp 16 | Ht 76.0 in | Wt 261.0 lb

## 2015-05-14 DIAGNOSIS — H6123 Impacted cerumen, bilateral: Secondary | ICD-10-CM | POA: Diagnosis not present

## 2015-05-14 DIAGNOSIS — R9431 Abnormal electrocardiogram [ECG] [EKG]: Secondary | ICD-10-CM | POA: Diagnosis not present

## 2015-05-14 DIAGNOSIS — Z0000 Encounter for general adult medical examination without abnormal findings: Secondary | ICD-10-CM | POA: Diagnosis not present

## 2015-05-14 DIAGNOSIS — Z0001 Encounter for general adult medical examination with abnormal findings: Secondary | ICD-10-CM

## 2015-05-14 DIAGNOSIS — R0609 Other forms of dyspnea: Secondary | ICD-10-CM | POA: Diagnosis not present

## 2015-05-14 DIAGNOSIS — R0600 Dyspnea, unspecified: Secondary | ICD-10-CM

## 2015-05-14 DIAGNOSIS — I10 Essential (primary) hypertension: Secondary | ICD-10-CM | POA: Diagnosis not present

## 2015-05-14 LAB — COMPREHENSIVE METABOLIC PANEL
ALK PHOS: 76 U/L (ref 39–117)
ALT: 24 U/L (ref 0–53)
AST: 14 U/L (ref 0–37)
Albumin: 4.3 g/dL (ref 3.5–5.2)
BILIRUBIN TOTAL: 0.6 mg/dL (ref 0.2–1.2)
BUN: 17 mg/dL (ref 6–23)
CALCIUM: 9.4 mg/dL (ref 8.4–10.5)
CO2: 30 mEq/L (ref 19–32)
Chloride: 104 mEq/L (ref 96–112)
Creatinine, Ser: 1.06 mg/dL (ref 0.40–1.50)
GFR: 77.42 mL/min (ref >60.00)
Glucose, Bld: 98 mg/dL (ref 70–99)
Potassium: 4.7 mEq/L (ref 3.5–5.1)
Sodium: 140 mEq/L (ref 135–145)
TOTAL PROTEIN: 6.6 g/dL (ref 6.0–8.3)

## 2015-05-14 LAB — CBC WITH DIFFERENTIAL/PLATELET
BASOS ABS: 0 10*3/uL (ref 0.0–0.1)
Basophils Relative: 0.6 % (ref 0.0–3.0)
Eosinophils Absolute: 0.1 10*3/uL (ref 0.0–0.7)
Eosinophils Relative: 1 % (ref 0.0–5.0)
HEMATOCRIT: 45.3 % (ref 39.0–52.0)
Hemoglobin: 15.3 g/dL (ref 13.0–17.0)
LYMPHS ABS: 2.1 10*3/uL (ref 0.7–4.0)
LYMPHS PCT: 29.7 % (ref 12.0–46.0)
MCHC: 33.9 g/dL (ref 30.0–36.0)
MCV: 84.2 fl (ref 78.0–100.0)
MONOS PCT: 5.2 % (ref 3.0–12.0)
Monocytes Absolute: 0.4 10*3/uL (ref 0.1–1.0)
NEUTROS PCT: 63.5 % (ref 43.0–77.0)
Neutro Abs: 4.5 10*3/uL (ref 1.4–7.7)
Platelets: 244 10*3/uL (ref 150.0–400.0)
RBC: 5.38 Mil/uL (ref 4.22–5.81)
RDW: 14 % (ref 11.5–15.5)
WBC: 7.1 10*3/uL (ref 4.0–10.5)

## 2015-05-14 LAB — URINALYSIS, ROUTINE W REFLEX MICROSCOPIC
Bilirubin Urine: NEGATIVE
Hgb urine dipstick: NEGATIVE
KETONES UR: NEGATIVE
LEUKOCYTES UA: NEGATIVE
Nitrite: NEGATIVE
PH: 6 (ref 5.0–8.0)
RBC / HPF: NONE SEEN (ref 0–?)
SPECIFIC GRAVITY, URINE: 1.025 (ref 1.000–1.030)
Total Protein, Urine: NEGATIVE
URINE GLUCOSE: NEGATIVE
UROBILINOGEN UA: 0.2 (ref 0.0–1.0)

## 2015-05-14 LAB — CARDIAC PANEL
CK MB: 0.9 ng/mL (ref 0.3–4.0)
CK TOTAL: 61 U/L (ref 7–232)
RELATIVE INDEX: 1.5 calc (ref 0.0–2.5)

## 2015-05-14 LAB — LIPID PANEL
Cholesterol: 174 mg/dL (ref 0–200)
HDL: 43.2 mg/dL (ref >39.00)
LDL Cholesterol: 111 mg/dL — ABNORMAL HIGH (ref 0–99)
NonHDL: 130.39
TRIGLYCERIDES: 96 mg/dL (ref 0.0–149.0)
Total CHOL/HDL Ratio: 4
VLDL: 19.2 mg/dL (ref 0.0–40.0)

## 2015-05-14 LAB — FECAL OCCULT BLOOD, GUAIAC: FECAL OCCULT BLD: NEGATIVE

## 2015-05-14 LAB — TROPONIN I: TNIDX: 0 ug/l (ref 0.00–0.06)

## 2015-05-14 LAB — HIV ANTIBODY (ROUTINE TESTING W REFLEX): HIV: NONREACTIVE

## 2015-05-14 LAB — HEPATITIS C ANTIBODY: HCV Ab: NEGATIVE

## 2015-05-14 LAB — BRAIN NATRIURETIC PEPTIDE: Pro B Natriuretic peptide (BNP): 12 pg/mL (ref 0.0–100.0)

## 2015-05-14 LAB — PSA: PSA: 1.02 ng/mL (ref 0.10–4.00)

## 2015-05-14 LAB — TSH: TSH: 0.73 u[IU]/mL (ref 0.35–4.50)

## 2015-05-14 MED ORDER — OLMESARTAN MEDOXOMIL 40 MG PO TABS: 40.0000 mg | ORAL_TABLET | Freq: Every day | ORAL | Status: DC

## 2015-05-14 NOTE — Progress Notes (Signed)
Pre visit review using our clinic review tool, if applicable. No additional management support is needed unless otherwise documented below in the visit note. 

## 2015-05-14 NOTE — Progress Notes (Signed)
Subjective:  Patient ID: Matthew Grant, male    DOB: [birth year]  Age: 55 y.o. MRN: [id number]  CC: Hypertension; Annual Exam; and Cerumen Impaction   HPI Matthew Grant presents for a CPX.  He complains of a muffled hearing sensation in both ears and wants his ears checked for wax.  He is also due for blood pressure check. He tells me that he sometimes misses his blood pressure medication on weekends and during those days the blood pressure increases but for the most part he reports good blood pressure control. He complains that he has developed dyspnea on exertion over the last 6 months. He denies chest pain, palpitations, diaphoresis, syncope, or edema.  Outpatient Prescriptions Prior to Visit  Medication Sig Dispense Refill  . aspirin 81 MG tablet Take 81 mg by mouth daily.    Marland Kitchen olmesartan (BENICAR) 40 MG tablet Take 40 mg by mouth daily.     No facility-administered medications prior to visit.    ROS Review of Systems  Constitutional: Negative.  Negative for fever, chills, diaphoresis, appetite change and fatigue.  HENT: Positive for hearing loss. Negative for ear pain, facial swelling, sore throat, tinnitus and trouble swallowing.   Eyes: Negative.  Negative for visual disturbance.  Respiratory: Positive for shortness of breath. Negative for cough, choking, chest tightness and stridor.   Cardiovascular: Negative.  Negative for chest pain, palpitations and leg swelling.  Gastrointestinal: Negative.  Negative for nausea, vomiting, abdominal pain, diarrhea, constipation and blood in stool.  Endocrine: Negative.   Genitourinary: Negative.  Negative for dysuria, urgency, hematuria, decreased urine volume and difficulty urinating.  Musculoskeletal: Negative.  Negative for myalgias, back pain, joint swelling, arthralgias and neck pain.  Skin: Negative.  Negative for color change and rash.  Allergic/Immunologic: Negative.   Neurological: Negative.  Negative for dizziness,  tremors, syncope, light-headedness, numbness and headaches.  Hematological: Negative.  Negative for adenopathy. Does not bruise/bleed easily.  Psychiatric/Behavioral: Negative.     Objective:  BP 124/78 mmHg  Pulse 63  Temp(Src) 98 F (36.7 C) (Oral)  Resp 16  Ht 6\' 4"  (1.93 m)  Wt 261 lb (118.389 kg)  BMI 31.78 kg/m2  SpO2 94%  BP Readings from Last 3 Encounters:  05/14/15 124/78  01/08/15 132/67  05/22/12 150/100    Wt Readings from Last 3 Encounters:  05/14/15 261 lb (118.389 kg)  01/08/15 260 lb (117.935 kg)  12/25/14 260 lb 6.4 oz (118.117 kg)    Physical Exam  Constitutional: He is oriented to person, place, and time. He appears well-developed and well-nourished. No distress.  HENT:  Head: Normocephalic and atraumatic.  Right Ear: Hearing, tympanic membrane and external ear normal. No swelling or tenderness. A foreign body (cerumen) is present.  Left Ear: Hearing, tympanic membrane and external ear normal. No swelling or tenderness. A foreign body (cerumen) is present.  Mouth/Throat: Oropharynx is clear and moist. No oropharyngeal exudate.  I put Colace in both ears and irrigated them with water and used an ear wick to remove the wax. After the wax was removed he tells me that his hearing is much improved and the muffled sensation in his ears has resolved. Examination afterwards shows that the tympanic membrane and external auditory canal are normal.  Eyes: Conjunctivae are normal. Right eye exhibits no discharge. Left eye exhibits no discharge. No scleral icterus.  Neck: Normal range of motion. Neck supple. No JVD present. No tracheal deviation present. No thyromegaly present.  Cardiovascular: Normal rate, regular rhythm, normal  heart sounds and intact distal pulses.  Exam reveals no gallop and no friction rub.   No murmur heard. EKG ---  Sinus  Bradycardia  - occasional ectopic ventricular beat    WITHIN NORMAL LIMITS  Pulmonary/Chest: Effort normal and breath  sounds normal. No stridor. No respiratory distress. He has no wheezes. He has no rales. He exhibits no tenderness.  Abdominal: Soft. Bowel sounds are normal. He exhibits no distension and no mass. There is no tenderness. There is no rebound and no guarding. Hernia confirmed negative in the right inguinal area and confirmed negative in the left inguinal area.  Genitourinary: Rectum normal, prostate normal, testes normal and penis normal. Rectal exam shows no external hemorrhoid, no internal hemorrhoid, no fissure, no mass, no tenderness and anal tone normal. Guaiac negative stool. Prostate is not enlarged and not tender. Right testis shows no mass, no swelling and no tenderness. Right testis is descended. Left testis shows no mass, no swelling and no tenderness. Left testis is descended. Circumcised. No penile erythema or penile tenderness. No discharge found.  Musculoskeletal: Normal range of motion. He exhibits no edema or tenderness.  Lymphadenopathy:    He has no cervical adenopathy.       Right: No inguinal adenopathy present.       Left: No inguinal adenopathy present.  Neurological: He is oriented to person, place, and time.  Skin: Skin is warm and dry. No rash noted. He is not diaphoretic. No erythema. No pallor.  Psychiatric: He has a normal mood and affect. His behavior is normal. Judgment and thought content normal.  Vitals reviewed.   Lab Results  Component Value Date   WBC 7.1 05/14/2015   HGB 15.3 05/14/2015   HCT 45.3 05/14/2015   PLT 244.0 05/14/2015   GLUCOSE 98 05/14/2015   CHOL 174 05/14/2015   TRIG 96.0 05/14/2015   HDL 43.20 05/14/2015   LDLCALC 111* 05/14/2015   ALT 24 05/14/2015   AST 14 05/14/2015   NA 140 05/14/2015   K 4.7 05/14/2015   CL 104 05/14/2015   CREATININE 1.06 05/14/2015   BUN 17 05/14/2015   CO2 30 05/14/2015   TSH 0.73 05/14/2015   PSA 1.02 05/14/2015    No results found.  Assessment & Plan:   Matthew Grant was seen today for hypertension,  annual exam and cerumen impaction.  Diagnoses and all orders for this visit:  Routine general medical examination at a health care facility- Exam complete, labs ordered and reviewed, his colonoscopy is up-to-date, vaccines were reviewed and updated, patient education material was given. -     Lipid panel; Future -     Comprehensive metabolic panel; Future -     CBC with Differential/Platelet; Future -     PSA; Future -     TSH; Future -     Urinalysis, Routine w reflex microscopic (not at  Eye Clinic); Future -     Hepatitis C antibody; Future -     HIV antibody; Future  Nonspecific abnormal electrocardiogram (ECG) (EKG)- he has dyspnea on exertion and his EKG shows sinus bradycardia and a few PVCs but no evidence of LVH or ischemia, I've asked him to undergo an exercise treadmill test to screen for significant dysrhythmia or ischemia.  Essential hypertension, benign- he agrees to be compliant with his antihypertensive medication every day of the week, for now his blood pressure is well-controlled and his electrolytes and renal function are stable. -     olmesartan (BENICAR) 40 MG tablet; Take  1 tablet (40 mg total) by mouth daily.  DOE (dyspnea on exertion) - his EKG does not show any signs of ischemia today he does have sinus bradycardia and some PVCs so will order an exercise treadmill test. As his cardiac enzymes today are normal and his BMP is negative for any signs of fluid overload. -     EKG 12-Lead -     Exercise Tolerance Test; Future -     Troponin I; Future -     Cardiac panel; Future -     Brain natriuretic peptide; Future  Cerumen impaction, bilateral- both ears were successfully irrigated and an ear pick was used to remove the wax.   I have changed Matthew Grant's olmesartan. I am also having him maintain his aspirin.  Meds ordered this encounter  Medications  . olmesartan (BENICAR) 40 MG tablet    Sig: Take 1 tablet (40 mg total) by mouth daily.    Dispense:  92 tablet     Refill:  3     Follow-up: Return in about 3 months (around 08/13/2015).  Scarlette Calico, MD

## 2015-05-14 NOTE — Patient Instructions (Signed)

## 2015-05-16 ENCOUNTER — Encounter: Payer: Self-pay | Admitting: Internal Medicine

## 2015-05-17 DIAGNOSIS — R0600 Dyspnea, unspecified: Secondary | ICD-10-CM | POA: Insufficient documentation

## 2015-05-17 DIAGNOSIS — R0609 Other forms of dyspnea: Secondary | ICD-10-CM

## 2015-05-17 DIAGNOSIS — H6120 Impacted cerumen, unspecified ear: Secondary | ICD-10-CM | POA: Insufficient documentation

## 2015-05-19 ENCOUNTER — Telehealth: Payer: Self-pay | Admitting: Internal Medicine

## 2015-05-19 NOTE — Telephone Encounter (Signed)
Pt aware.

## 2015-05-19 NOTE — Telephone Encounter (Signed)
Pt request lab result that was done on 05/14/15. Please call him back on [phone], ok to leave detail massage if no answer.

## 2015-05-19 NOTE — Telephone Encounter (Signed)
His labs were all normal A letter has been sent

## 2015-05-20 ENCOUNTER — Telehealth (HOSPITAL_COMMUNITY): Payer: Self-pay | Admitting: *Deleted

## 2015-05-20 NOTE — Telephone Encounter (Signed)
Left message for patient to call and schedule ETT ordered by Dr. Thomas Jones  

## 2015-05-25 NOTE — Telephone Encounter (Signed)
Returned call to [employer] @ [phone]

## 2015-05-27 NOTE — Telephone Encounter (Signed)
Left message for Mrs. To call back

## 2015-06-04 ENCOUNTER — Telehealth: Payer: Self-pay | Admitting: Internal Medicine

## 2015-06-04 NOTE — Telephone Encounter (Signed)
PLEASE NOTE: All timestamps contained within this report are represented as [place] Standard Time. CONFIDENTIALTY NOTICE: This fax transmission is intended only for the addressee. It contains information that is legally privileged, confidential or otherwise protected from use or disclosure. If you are not the intended recipient, you are strictly prohibited from reviewing, disclosing, copying using or disseminating any of this information or taking any action in reliance on or regarding this information. If you have received this fax in error, please notify us immediately by telephone so that we can arrange for its return to [place]. Phone: [phone], Toll-Free: [phone], Fax: [phone] Page: 1 of 1 Call Id: [id number] [facility] - Client [place] Patient Name: Matthew Grant DOB: [birth year] Initial Comment Caller states thinks he has food poisoning, ate mushrooms last night that didn't taste right. Nurse Assessment Nurse: Dimas Chyle, RN, Dellis Filbert Date/Time Eilene Ghazi Time): 06/04/2015 11:31:15 AM Confirm and document reason for call. If symptomatic, describe symptoms. You must click the next button to save text entered. ---Caller states thinks he has food poisoning, ate mushrooms last night that didn't taste right. Having vomiting and diarrhea. Also running a fever. Fever of 100.4-100.6. Has the patient traveled out of the country within the last 30 days? ---No Does the patient have any new or worsening symptoms? ---Yes Will a triage be completed? ---Yes Related visit to physician within the last 2 weeks? ---No Does the PT have any chronic conditions? (i.e. diabetes, asthma, etc.) ---Yes List chronic conditions. ---HTN Is this a behavioral health or substance abuse call? ---No Guidelines Guideline Title Affirmed Question Affirmed Notes Vomiting [1] SEVERE vomiting (e.g., 6 or more times/day, vomits everything) BUT  [2] hydrated (all triage questions negative) Final Disposition User [place], RN, Dellis Filbert Disagree/Comply: Comply

## 2015-07-09 ENCOUNTER — Telehealth (HOSPITAL_COMMUNITY): Payer: Self-pay

## 2015-07-09 NOTE — Telephone Encounter (Signed)
Encounter complete. 

## 2015-07-10 ENCOUNTER — Telehealth (HOSPITAL_COMMUNITY): Payer: Self-pay

## 2015-07-10 NOTE — Telephone Encounter (Signed)
Encounter complete. 

## 2015-07-14 ENCOUNTER — Encounter (HOSPITAL_COMMUNITY): Payer: Self-pay | Admitting: *Deleted

## 2015-07-14 ENCOUNTER — Ambulatory Visit (HOSPITAL_COMMUNITY)
Admission: RE | Admit: 2015-07-14 | Discharge: 2015-07-14 | Disposition: A | Discharge status: Home / Self Care | Payer: 59 | Source: Ambulatory Visit | Attending: Internal Medicine | Admitting: Internal Medicine

## 2015-07-14 DIAGNOSIS — R0600 Dyspnea, unspecified: Secondary | ICD-10-CM

## 2015-07-14 DIAGNOSIS — R0609 Other forms of dyspnea: Secondary | ICD-10-CM | POA: Insufficient documentation

## 2015-07-14 DIAGNOSIS — R9439 Abnormal result of other cardiovascular function study: Secondary | ICD-10-CM | POA: Insufficient documentation

## 2015-07-14 LAB — EXERCISE TOLERANCE TEST
CHL CUP RESTING HR STRESS: 81 {beats}/min
CHL RATE OF PERCEIVED EXERTION: 17
CSEPHR: 101 %
CSEPPHR: 169 {beats}/min
Estimated workload: 8.3 METS
Exercise duration (min): 6 min
Exercise duration (sec): 56 s
MPHR: 167 {beats}/min

## 2015-07-14 NOTE — Progress Notes (Unsigned)
Abnormal ETT was taken to Dr. Oval Linsey and she gave the ok for patient to be discharged to go home.

## 2015-07-15 ENCOUNTER — Other Ambulatory Visit: Payer: Self-pay | Admitting: Internal Medicine

## 2015-07-15 DIAGNOSIS — R0609 Other forms of dyspnea: Secondary | ICD-10-CM

## 2015-07-15 DIAGNOSIS — R9439 Abnormal result of other cardiovascular function study: Secondary | ICD-10-CM

## 2015-07-15 DIAGNOSIS — R0600 Dyspnea, unspecified: Secondary | ICD-10-CM

## 2015-07-15 DIAGNOSIS — R9431 Abnormal electrocardiogram [ECG] [EKG]: Secondary | ICD-10-CM

## 2015-07-15 MED ORDER — ISOSORBIDE MONONITRATE ER 30 MG PO TB24: 30.0000 mg | ORAL_TABLET | Freq: Every day | ORAL | Status: DC

## 2015-07-21 ENCOUNTER — Encounter: Payer: Self-pay | Admitting: Cardiology

## 2015-08-04 ENCOUNTER — Ambulatory Visit (INDEPENDENT_AMBULATORY_CARE_PROVIDER_SITE_OTHER): Payer: 59 | Admitting: Cardiology

## 2015-08-04 ENCOUNTER — Encounter: Payer: Self-pay | Admitting: Cardiology

## 2015-08-04 ENCOUNTER — Encounter (INDEPENDENT_AMBULATORY_CARE_PROVIDER_SITE_OTHER): Payer: Self-pay

## 2015-08-04 VITALS — BP 116/80 | HR 69 | Ht 76.0 in | Wt 266.0 lb

## 2015-08-04 DIAGNOSIS — R9439 Abnormal result of other cardiovascular function study: Secondary | ICD-10-CM

## 2015-08-04 DIAGNOSIS — R0609 Other forms of dyspnea: Secondary | ICD-10-CM | POA: Diagnosis not present

## 2015-08-04 DIAGNOSIS — R0600 Dyspnea, unspecified: Secondary | ICD-10-CM

## 2015-08-04 NOTE — Progress Notes (Signed)
Electrophysiology Office Note   Date:  08/04/2015   ID:  Matthew Grant., DOB 08/07/1960, MRN [id number]  PCP:  Scarlette Calico, MD  Primary Electrophysiologist:  Elder Davidian Meredith Leeds, MD    Chief Complaint  Patient presents with  . New Patient (Initial Visit)     History of Present Illness: Matthew Grant. is a 55 y.o. male who presents today for electrophysiology evaluation.   History of hypertension, mitral valve prolapse. He presented to his primary physician's office with a nonspecifically abnormal EKG showing sinus bradycardia and PVCs. He says that he has noticed, over the last 45 days, that he has been a little bit more short of breath. He also endorses some burning sensation in the middle of his chest that is not necessarily associated with exertion. He is still able to do all of his daily activities. He underwent an exercise treadmill test which showed ST depressions of 1 mm consistent with ischemia.     Today, he denies symptoms of palpitations, chest pain, shortness of breath, orthopnea, PND, lower extremity edema, claudication, dizziness, presyncope, syncope, bleeding, or neurologic sequela. The patient is tolerating medications without difficulties and is otherwise without complaint today.    Past Medical History  Diagnosis Date  . Mitral valve prolapse   . Kidney calculi   . Hypertension   . Diverticulosis    Past Surgical History  Procedure Laterality Date  . Knee arthroscopy  1998    left  . Kidney stone surgery  02-2014  . Neck surgery  01-2013    herniated disc     Current Outpatient Prescriptions  Medication Sig Dispense Refill  . aspirin 81 MG tablet Take 81 mg by mouth daily.    . Multiple Vitamin (MULTIVITAMIN) tablet Take 1 tablet by mouth daily.    Marland Kitchen olmesartan (BENICAR) 40 MG tablet Take 1 tablet (40 mg total) by mouth daily. 92 tablet 3   No current facility-administered medications for this visit.    Allergies:   Iohexol   Social  History:  The patient  reports that he has never smoked. He has never used smokeless tobacco. He reports that he does not drink alcohol or use illicit drugs.   Family History:  The patient's family history includes Cancer in his father and mother; Colon cancer in his maternal grandfather and paternal grandfather; Heart disease in his father; Hyperlipidemia in his father and mother; Hypertension in his father and mother. There is no history of Stomach cancer.    ROS:  Please see the history of present illness.   Otherwise, review of systems is positive for leg swelling, DOE.   All other systems are reviewed and negative.    PHYSICAL EXAM: VS:  BP 116/80 mmHg  Pulse 69  Ht 6\' 4"  (1.93 m)  Wt 266 lb (120.657 kg)  BMI 32.39 kg/m2 , BMI Body mass index is 32.39 kg/(m^2). GEN: Well nourished, well developed, in no acute distress HEENT: normal Neck: no JVD, carotid bruits, or masses Cardiac: RRR; no murmurs, rubs, or gallops,no edema  Respiratory:  clear to auscultation bilaterally, normal work of breathing GI: soft, nontender, nondistended, + BS MS: no deformity or atrophy Skin: warm and dry Neuro:  Strength and sensation are intact Psych: euthymic mood, full affect  EKG:  EKG is ordered today. The ekg ordered today shows sinus rhythm, rate 69, nonspecific T wave flattening  Recent Labs: 05/14/2015: ALT 24; BUN 17; Creatinine, Ser 1.06; Hemoglobin 15.3; Platelets 244.0; Potassium 4.7;  Pro B Natriuretic peptide (BNP) 12.0; Sodium 140; TSH 0.73    Lipid Panel     Component Value Date/Time   CHOL 174 05/14/2015 0950   TRIG 96.0 05/14/2015 0950   HDL 43.20 05/14/2015 0950   CHOLHDL 4 05/14/2015 0950   VLDL 19.2 05/14/2015 0950   LDLCALC 111* 05/14/2015 0950     Wt Readings from Last 3 Encounters:  08/04/15 266 lb (120.657 kg)  05/14/15 261 lb (118.389 kg)  01/08/15 260 lb (117.935 kg)      Other studies Reviewed: Additional studies/ records that were reviewed today include:  ETT 07/14/15  Review of the above records today demonstrates:   Horizontal ST segment depression ST segment depression of 1 mm was noted during stress in the V3, V4 and V6 leads, which is consistent with ischemia.  Upsloping ST depression in II, III aVF and V5, which is not consistent with ischemia.  Specificity of these findings is limited by Q waves at baseline.   ASSESSMENT AND PLAN:  1.  Dyspnea on exertion: exercise treadmill test was positive with ST depressions seen with peak exercise.  Due to the positive test, we'll order imaging with a rest stress Myoview to further determine if he has evidence of coronary disease. Of note, he does say that he had a Myoview 18 years ago that showed a false positive with a cardiac cath which was negative for coronary disease.    Current medicines are reviewed at length with the patient today.   The patient does not have concerns regarding his medicines.  The following changes were made today:  none  Labs/ tests ordered today include:  Orders Placed This Encounter  Procedures  . Myocardial Perfusion Imaging  . EKG 12-Lead     Disposition:   FU with Matthew Grant post Myoview   Signed, Annella Prowell Meredith Leeds, MD  08/04/2015 3:42 PM     [place] [place] [address] [place] [address] [phone] (office) [phone] (fax)

## 2015-08-04 NOTE — Patient Instructions (Signed)
Medication Instructions:  Your physician recommends that you continue on your current medications as directed. Please refer to the Current Medication list given to you today.  Labwork: None ordered  Testing/Procedures: Your physician has requested that you have a lexiscan myoview. For further information please visit [email or web address]. Please follow instruction sheet, as given.  Follow-Up: To be determined once physician has reviewed the resting stress test.  We will call you with the results.  If you need a refill on your cardiac medications before your next appointment, please call your pharmacy.  Thank you for choosing [facility]!!   Trinidad Curet, RN [phone]   Any Other Special Instructions Will Be Listed Below (If Applicable).  Pharmacologic Stress Electrocardiogram A pharmacologic stress electrocardiogram is a heart (cardiac) test that uses nuclear imaging to evaluate the blood supply to your heart. This test may also be called a pharmacologic stress electrocardiography. Pharmacologic means that a medicine is used to increase your heart rate and blood pressure.  This stress test is done to find areas of poor blood flow to the heart by determining the extent of coronary artery disease (CAD). Some people exercise on a treadmill, which naturally increases the blood flow to the heart. For those people unable to exercise on a treadmill, a medicine is used. This medicine stimulates your heart and will cause your heart to beat harder and more quickly, as if you were exercising.  Pharmacologic stress tests can help determine:  The adequacy of blood flow to your heart during increased levels of activity in order to clear you for discharge home.  The extent of coronary artery blockage caused by CAD.  Your prognosis if you have suffered a heart attack.  The effectiveness of cardiac procedures done, such as an angioplasty, which can increase the circulation in your coronary  arteries.  Causes of chest pain or pressure. LET [facility] CARE PROVIDER KNOW ABOUT:  Any allergies you have.  All medicines you are taking, including vitamins, herbs, eye drops, creams, and over-the-counter medicines.  Previous problems you or members of your family have had with the use of anesthetics.  Any blood disorders you have.  Previous surgeries you have had.  Medical conditions you have.  Possibility of pregnancy, if this applies.  If you are currently breastfeeding. RISKS AND COMPLICATIONS Generally, this is a safe procedure. However, as with any procedure, complications can occur. Possible complications include:  You develop pain or pressure in the following areas:  Chest.  Jaw or neck.  Between your shoulder blades.  Radiating down your left arm.  Headache.  Dizziness or light-headedness.  Shortness of breath.  Increased or irregular heartbeat.  Low blood pressure.  Nausea or vomiting.  Flushing.  Redness going up the arm and slight pain during injection of medicine.  Heart attack (rare). BEFORE THE PROCEDURE   Avoid all forms of caffeine for 24 hours before your test or as directed by your health care provider. This includes coffee, tea (even decaffeinated tea), caffeinated sodas, chocolate, cocoa, and certain pain medicines.  Follow your health care provider's instructions regarding eating and drinking before the test.  Take your medicines as directed at regular times with water unless instructed otherwise. Exceptions may include:  If you have diabetes, ask how you are to take your insulin or pills. It is common to adjust insulin dosing the morning of the test.  If you are taking beta-blocker medicines, it is important to talk to your health care provider about these medicines well  before the date of your test. Taking beta-blocker medicines may interfere with the test. In some cases, these medicines need to be changed or stopped 24 hours or  more before the test.  If you wear a nitroglycerin patch, it may need to be removed prior to the test. Ask your health care provider if the patch should be removed before the test.  If you use an inhaler for any breathing condition, bring it with you to the test.  If you are an outpatient, bring a snack so you can eat right after the stress phase of the test.  Do not smoke for 4 hours prior to the test or as directed by your health care provider.  Do not apply lotions, powders, creams, or oils on your chest prior to the test.  Wear comfortable shoes and clothing. Let your health care provider know if you were unable to complete or follow the preparations for your test. PROCEDURE   Multiple patches (electrodes) will be put on your chest. If needed, small areas of your chest may be shaved to get better contact with the electrodes. Once the electrodes are attached to your body, multiple wires will be attached to the electrodes, and your heart rate will be monitored.  An IV access will be started. A nuclear trace (isotope) is given. The isotope may be given intravenously, or it may be swallowed. Nuclear refers to several types of radioactive isotopes, and the nuclear isotope lights up the arteries so that the nuclear images are clear. The isotope is absorbed by your body. This results in low radiation exposure.  A resting nuclear image is taken to show how your heart functions at rest.  A medicine is given through the IV access.  A second scan is done about 1 hour after the medicine injection and determines how your heart functions under stress.  During this stress phase, you will be connected to an electrocardiogram machine. Your blood pressure and oxygen levels will be monitored. AFTER THE PROCEDURE   Your heart rate and blood pressure will be monitored after the test.  You may return to your normal schedule, including diet,activities, and medicines, unless your health care provider  tells you otherwise.   This information is not intended to replace advice given to you by your health care provider. Make sure you discuss any questions you have with your health care provider.   Document Released: 05/29/2008 Document Revised: 01/15/2013 Document Reviewed: 09/17/2012 Elsevier Interactive Patient Education [employer].

## 2015-08-12 ENCOUNTER — Telehealth (HOSPITAL_COMMUNITY): Payer: Self-pay | Admitting: *Deleted

## 2015-08-12 NOTE — Telephone Encounter (Signed)
Left message on voicemail in reference to upcoming appointment scheduled for 08/17/15 Phone number given for a call back so details instructions can be given. Dorrance Sellick J Cayce Paschal, RN  

## 2015-08-17 ENCOUNTER — Encounter (INDEPENDENT_AMBULATORY_CARE_PROVIDER_SITE_OTHER): Payer: Self-pay

## 2015-08-17 ENCOUNTER — Ambulatory Visit: Payer: 59 | Admitting: Internal Medicine

## 2015-08-17 ENCOUNTER — Ambulatory Visit (HOSPITAL_COMMUNITY): Payer: 59 | Attending: Internal Medicine

## 2015-08-17 DIAGNOSIS — R0600 Dyspnea, unspecified: Secondary | ICD-10-CM

## 2015-08-17 DIAGNOSIS — R0609 Other forms of dyspnea: Secondary | ICD-10-CM | POA: Diagnosis not present

## 2015-08-17 DIAGNOSIS — I10 Essential (primary) hypertension: Secondary | ICD-10-CM | POA: Diagnosis not present

## 2015-08-17 DIAGNOSIS — R9439 Abnormal result of other cardiovascular function study: Secondary | ICD-10-CM | POA: Insufficient documentation

## 2015-08-17 LAB — MYOCARDIAL PERFUSION IMAGING
CHL CUP NUCLEAR SDS: 0
CHL CUP NUCLEAR SSS: 8
CSEPPHR: 113 {beats}/min
LHR: 0.31
LV sys vol: 63 mL
LVDIAVOL: 130 mL (ref 62–150)
NUC STRESS TID: 0.96
Rest HR: 57 {beats}/min
SRS: 8

## 2015-08-17 MED ORDER — TECHNETIUM TC 99M TETROFOSMIN IV KIT
32.6000 | PACK | Freq: Once | INTRAVENOUS | Status: AC | PRN
  Administered 2015-08-17: 33 via INTRAVENOUS
  Filled 2015-08-17: qty 33

## 2015-08-17 MED ORDER — TECHNETIUM TC 99M TETROFOSMIN IV KIT
10.1000 | PACK | Freq: Once | INTRAVENOUS | Status: AC | PRN
  Administered 2015-08-17: 10.1 via INTRAVENOUS
  Filled 2015-08-17: qty 10

## 2015-08-17 MED ORDER — REGADENOSON 0.4 MG/5ML IV SOLN
0.4000 mg | Freq: Once | INTRAVENOUS | Status: AC
  Administered 2015-08-17: 0.4 mg via INTRAVENOUS

## 2015-08-20 ENCOUNTER — Encounter: Payer: Self-pay | Admitting: Cardiology

## 2015-08-20 NOTE — Telephone Encounter (Signed)
New message ° ° ° ° ° ° °Pt returning nurse call  °

## 2015-08-20 NOTE — Telephone Encounter (Signed)
This encounter was created in error - please disregard.

## 2016-03-17 ENCOUNTER — Encounter: Payer: Self-pay | Admitting: Internal Medicine

## 2016-03-18 ENCOUNTER — Ambulatory Visit (INDEPENDENT_AMBULATORY_CARE_PROVIDER_SITE_OTHER): Payer: 59 | Admitting: Internal Medicine

## 2016-03-18 ENCOUNTER — Encounter: Payer: Self-pay | Admitting: Internal Medicine

## 2016-03-18 VITALS — BP 132/82 | HR 67 | Temp 98.5°F | Ht 76.0 in | Wt 270.0 lb

## 2016-03-18 DIAGNOSIS — G4710 Hypersomnia, unspecified: Secondary | ICD-10-CM | POA: Diagnosis not present

## 2016-03-18 DIAGNOSIS — I10 Essential (primary) hypertension: Secondary | ICD-10-CM

## 2016-03-18 DIAGNOSIS — F419 Anxiety disorder, unspecified: Secondary | ICD-10-CM | POA: Insufficient documentation

## 2016-03-18 HISTORY — DX: Anxiety disorder, unspecified: F41.9

## 2016-03-18 MED ORDER — OLMESARTAN MEDOXOMIL-HCTZ 40-12.5 MG PO TABS: 1.0000 | ORAL_TABLET | Freq: Every day | ORAL | 3 refills | Status: DC

## 2016-03-18 MED ORDER — SERTRALINE HCL 50 MG PO TABS: 50.0000 mg | ORAL_TABLET | Freq: Every day | ORAL | 3 refills | Status: DC

## 2016-03-18 NOTE — Assessment & Plan Note (Signed)
Mild to mod, cant r/o osa - for pulm referral, cont wt loss efforts

## 2016-03-18 NOTE — Assessment & Plan Note (Signed)
Likely mild uncontrolled, for change benicar 40 to benicar hct 40/12.5, cont to monitor BP at home and next visit

## 2016-03-18 NOTE — Patient Instructions (Addendum)
OK to change the Benicar to Benicar HCT 40/12.5 - 1 per day  Please take all new medication as prescribed - the zoloft 50 mg per day  You will be contacted regarding the referral for: Pulmonary  Please continue all other medications as before, and refills have been done if requested.  Please have the pharmacy call with any other refills you may need.  Please continue your efforts at being more active, low cholesterol low salt diet, and weight control.  Please keep your appointments with your specialists as you may have planned

## 2016-03-18 NOTE — Progress Notes (Signed)
Subjective:    Patient ID: Matthew Grant., male    DOB: 1960/12/07, 56 y.o.   MRN: [id number]  HPI  Here to fu with wife, pt of Dr Ronnald Ramp who is out of office today, Wife reports pt was not feeling well starting feb 15 without other specific symtpoms,  And checking BP at home per wife with new machine - 147/107, better later that evening, but mild high on several occasions since that time.  Has gained significant wt recently due to work stress, and excess calories, less activity , worsening diet.  BP Readings from Last 3 Encounters:  03/18/16 132/82  08/04/15 116/80  05/14/15 124/78   Wt Readings from Last 3 Encounters:  03/18/16 270 lb (122.5 kg)  08/04/15 266 lb (120.7 kg)  05/14/15 261 lb (118.4 kg)  Also now with daytime somnolence that he can tolerate with work stimulation but can fall asleep anytime at home, snores at night.   No prior hx of osa.  Has chronic persistent trace ankle edema for several months only, seemed worse with gaining wt.  Also Denies worsening depressive symptoms, suicidal ideation, or panic; but has ongoing anxiety with significant wrosening recently, due to work stress.  Has been on zoloft in past, asks to restart  Past Medical History:  Diagnosis Date  . Anxiety 03/18/2016  . Diverticulosis   . Hypertension   . Kidney calculi   . Mitral valve prolapse    Past Surgical History:  Procedure Laterality Date  . KIDNEY STONE SURGERY  02-2014  . KNEE ARTHROSCOPY  1998   left  . NECK SURGERY  01-2013   herniated disc    reports that he has never smoked. He has never used smokeless tobacco. He reports that he does not drink alcohol or use drugs. family history includes Cancer in his father and mother; Colon cancer in his maternal grandfather and paternal grandfather; Heart disease in his father; Hyperlipidemia in his father and mother; Hypertension in his father and mother. Allergies  Allergen Reactions  . Iohexol      Desc: SEVERE NAUSEA AND VOMITING WITH  IV CONTRAST-    Current Outpatient Prescriptions on File Prior to Visit  Medication Sig Dispense Refill  . aspirin 81 MG tablet Take 81 mg by mouth daily.    . Multiple Vitamin (MULTIVITAMIN) tablet Take 1 tablet by mouth daily.     No current facility-administered medications on file prior to visit.    Review of Systems  Constitutional: Negative for unusual diaphoresis or night sweats HENT: Negative for ear swelling or discharge Eyes: Negative for worsening visual haziness  Respiratory: Negative for choking and stridor.   Gastrointestinal: Negative for distension or worsening eructation Genitourinary: Negative for retention or change in urine volume.  Musculoskeletal: Negative for other MSK pain or swelling Skin: Negative for color change and worsening wound Neurological: Negative for tremors and numbness other than noted  Psychiatric/Behavioral: Negative for decreased concentration or agitation other than above   All other system neg per pt    Objective:   Physical Exam BP 132/82   Pulse 67   Temp 98.5 F (36.9 C)   Ht 6\' 4"  (1.93 m)   Wt 270 lb (122.5 kg)   SpO2 98%   BMI 32.87 kg/m  VS noted, obese Constitutional: Pt appears in no apparent distress HENT: Head: NCAT.  Right Ear: External ear normal.  Left Ear: External ear normal.  Eyes: . Pupils are equal, round, and reactive to light.  Conjunctivae and EOM are normal Neck: Normal range of motion. Neck supple.  Cardiovascular: Normal rate and regular rhythm.   Pulmonary/Chest: Effort normal and breath sounds without rales or wheezing.  Neurological: Pt is alert. Not confused , motor grossly intact Skin: Skin is warm. No rash, trace bilat LE edema to ankles Psychiatric: Pt behavior is normal. No agitation. 1-2+ nervous  No other new exam findings    Assessment & Plan:

## 2016-03-18 NOTE — Assessment & Plan Note (Signed)
Mild to mod, for start zoloft 50 qd,  to f/u any worsening symptoms or concerns

## 2016-03-23 ENCOUNTER — Emergency Department (HOSPITAL_COMMUNITY)
Admission: EM | Admit: 2016-03-23 | Discharge: 2016-03-24 | Disposition: A | Discharge status: Home / Self Care | Payer: 59 | Attending: Emergency Medicine | Admitting: Emergency Medicine

## 2016-03-23 ENCOUNTER — Emergency Department (HOSPITAL_COMMUNITY): Payer: 59

## 2016-03-23 ENCOUNTER — Encounter (HOSPITAL_COMMUNITY): Payer: Self-pay | Admitting: Emergency Medicine

## 2016-03-23 DIAGNOSIS — R079 Chest pain, unspecified: Secondary | ICD-10-CM

## 2016-03-23 DIAGNOSIS — Z7982 Long term (current) use of aspirin: Secondary | ICD-10-CM | POA: Diagnosis not present

## 2016-03-23 DIAGNOSIS — Z79899 Other long term (current) drug therapy: Secondary | ICD-10-CM | POA: Insufficient documentation

## 2016-03-23 DIAGNOSIS — R0789 Other chest pain: Secondary | ICD-10-CM | POA: Insufficient documentation

## 2016-03-23 DIAGNOSIS — I10 Essential (primary) hypertension: Secondary | ICD-10-CM | POA: Insufficient documentation

## 2016-03-23 DIAGNOSIS — R0602 Shortness of breath: Secondary | ICD-10-CM | POA: Diagnosis not present

## 2016-03-23 LAB — BASIC METABOLIC PANEL
ANION GAP: 11 (ref 5–15)
BUN: 22 mg/dL — AB (ref 6–20)
CHLORIDE: 102 mmol/L (ref 101–111)
CO2: 26 mmol/L (ref 22–32)
Calcium: 8.8 mg/dL — ABNORMAL LOW (ref 8.9–10.3)
Creatinine, Ser: 1.1 mg/dL (ref 0.61–1.24)
Glucose, Bld: 105 mg/dL — ABNORMAL HIGH (ref 65–99)
POTASSIUM: 3.6 mmol/L (ref 3.5–5.1)
SODIUM: 139 mmol/L (ref 135–145)

## 2016-03-23 LAB — I-STAT TROPONIN, ED: Troponin i, poc: 0.01 ng/mL (ref 0.00–0.08)

## 2016-03-23 LAB — CBC
HEMATOCRIT: 43.8 % (ref 39.0–52.0)
Hemoglobin: 14.6 g/dL (ref 13.0–17.0)
MCH: 28.6 pg (ref 26.0–34.0)
MCHC: 33.3 g/dL (ref 30.0–36.0)
MCV: 85.9 fL (ref 78.0–100.0)
Platelets: 218 10*3/uL (ref 150–400)
RBC: 5.1 MIL/uL (ref 4.22–5.81)
RDW: 14 % (ref 11.5–15.5)
WBC: 9.2 10*3/uL (ref 4.0–10.5)

## 2016-03-23 NOTE — ED Triage Notes (Signed)
Pt arrives EMS with c/o chest pain shobnausea and left hand tingling since Sunday. Worse today.Given 324 mg Aspirin and 3 nitro sl and 4 mg iv zofran given eInroute by EMS.  With relief.

## 2016-03-23 NOTE — ED Provider Notes (Signed)
[facility] DEPT Provider Note   CSN: [id number] Arrival date & time: 03/23/16  1901     History   Chief Complaint No chief complaint on file.   HPI Darcey Bonifacio. is a 56 y.o. male.  HPI   56 year old male with history of hypertension, mitral valve prolapse, anxiety brought here via EMS from home for evaluation of chest pain. Patient report for the past 3-4 days he has had in many episodes of chest discomfort. He described prior his chest discomfort as a left-sided chest pressure radiates to his arm with associate nausea, diaphoretic, and shortness of breath, usually lasting for 20-30 minutes. Sometimes brought on by exertion but today while he was sitting in the car pain came about. EMS did gave him aspirin and sublingual nitroglycerin which has improved his pain. Patient currently endorses mild discomfort but no significant pain. He does take a baby aspirin. He admits that he has been under a lot of stress with by a new house. Also reported family history of cardiac disease. Patient is not a smoker or drinker. He mentioned last week he has recurrent episodes of lightheadedness, and also states that for the past several months he has noticed swelling to both of his legs. He was seen by his PCP last week for this complaint and was given additional fluid pill and Zoloft on top of his blood pressure medication. He has been compliant with his medication. Denies any prior history of PE or DVT, no recent surgery, prolonged bed rest, active cancer or hemoptysis. Did his prior chart, it was documented that patient had an abnormal cardiac stress test however, patient states that he was told the stress test was normal and he did not have any further intervention and no heart catheterization. Denies alcohol or tobacco abuse. Denies any recent injury. His recent chest discomfort lasting for approximately 45 minutes.  Past Medical History:  Diagnosis Date  . Anxiety 03/18/2016  . Diverticulosis     . Hypertension   . Kidney calculi   . Mitral valve prolapse     Patient Active Problem List   Diagnosis Date Noted  . Hypersomnolence 03/18/2016  . Anxiety 03/18/2016  . Abnormal stress electrocardiogram test using treadmill 07/15/2015  . DOE (dyspnea on exertion) 05/17/2015  . Cerumen impaction 05/17/2015  . Essential hypertension, benign 05/22/2012  . Nonspecific abnormal electrocardiogram (ECG) (EKG) 05/22/2012  . Routine general medical examination at a health care facility 10/31/2011    Past Surgical History:  Procedure Laterality Date  . KIDNEY STONE SURGERY  02-2014  . KNEE ARTHROSCOPY  1998   left  . NECK SURGERY  01-2013   herniated disc       Home Medications    Prior to Admission medications   Medication Sig Start Date End Date Taking? Authorizing Provider  aspirin 81 MG tablet Take 81 mg by mouth daily.    Historical Provider, MD  Multiple Vitamin (MULTIVITAMIN) tablet Take 1 tablet by mouth daily.    Historical Provider, MD  olmesartan-hydrochlorothiazide (BENICAR HCT) 40-12.5 MG tablet Take 1 tablet by mouth daily. 03/18/16   Biagio Borg, MD  sertraline (ZOLOFT) 50 MG tablet Take 1 tablet (50 mg total) by mouth daily. 03/18/16   Biagio Borg, MD    Family History Family History  Problem Relation Age of Onset  . Cancer Mother   . Hypertension Mother   . Hyperlipidemia Mother   . Cancer Father   . Hypertension Father   . Hyperlipidemia Father   .  Heart disease Father   . Stomach cancer Neg Hx   . Colon cancer Maternal Grandfather   . Colon cancer Paternal Grandfather     Social History Social History  Substance Use Topics  . Smoking status: Never Smoker  . Smokeless tobacco: Never Used  . Alcohol use No     Allergies   Iohexol   Review of Systems Review of Systems  All other systems reviewed and are negative.    Physical Exam Updated Vital Signs BP 111/73   Pulse 62   Resp 17   Ht 6\' 4"  (1.93 m)   Wt 122.5 kg   SpO2 94%   BMI  32.87 kg/m   Physical Exam  Constitutional: He is oriented to person, place, and time. He appears well-developed and well-nourished. No distress.  HENT:  Head: Atraumatic.  Eyes: Conjunctivae are normal.  Neck: Neck supple. No JVD present.  Cardiovascular: Normal rate, regular rhythm and intact distal pulses.   Pulmonary/Chest: Effort normal and breath sounds normal. He exhibits no tenderness.  Abdominal: Soft. There is no tenderness.  Musculoskeletal: He exhibits edema (Trace nonpitting edema to bilateral lower extremities with intact distal pulses.).  Neurological: He is alert and oriented to person, place, and time.  Skin: No rash noted.  Psychiatric: He has a normal mood and affect.  Nursing note and vitals reviewed.    ED Treatments / Results  Labs (all labs ordered are listed, but only abnormal results are displayed) Labs Reviewed  BASIC METABOLIC PANEL - Abnormal; Notable for the following:       Result Value   Glucose, Bld 105 (*)    BUN 22 (*)    Calcium 8.8 (*)    All other components within normal limits  CBC  I-STAT TROPOININ, ED  I-STAT TROPOININ, ED    EKG  EKG Interpretation  Date/Time:  Wednesday March 23 2016 19:12:31 EST Ventricular Rate:  74 PR Interval:    QRS Duration: 100 QT Interval:  398 QTC Calculation: 442 R Axis:   46 Text Interpretation:  Sinus rhythm Minimal ST depression, inferior leads No significant change since last tracing Confirmed by [facility] MD, JASON [phone]) on 03/23/2016 7:15:39 PM       Radiology Dg Chest Port 1 View  Result Date: 03/23/2016 CLINICAL DATA:  Chest pain. Shortness of breath. Nausea. Left hand tingling. EXAM: PORTABLE CHEST 1 VIEW COMPARISON:  None. FINDINGS: The heart size is exaggerated by low lung volumes. Minimal bibasilar atelectasis is present. There is no focal airspace disease. Cervical fusion is noted. IMPRESSION: 1. Low lung volumes. 2. No acute cardiopulmonary disease. Electronically Signed   By:  San Morelle M.D.   On: 03/23/2016 20:13    Procedures Procedures (including critical care time)  Medications Ordered in ED Medications - No data to display   Initial Impression / Assessment and Plan / ED Course  I have reviewed the triage vital signs and the nursing notes.  Pertinent labs & imaging results that were available during my care of the patient were reviewed by me and considered in my medical decision making (see chart for details).     BP 111/73   Pulse 62   Resp 17   Ht 6\' 4"  (1.93 m)   Wt 122.5 kg   SpO2 94%   BMI 32.87 kg/m    Final Clinical Impressions(s) / ED Diagnoses   Final diagnoses:  Chest pain, unspecified type    New Prescriptions New Prescriptions   No medications on  file   7:34 PM Pt here with CP concerning for angina.  Will benefit from cardiac rule out.  Minimal chest discomfort at this time.  Pt is stable.  EKG without concerning ischemic changes.  Work up initiated.    7:49 PM HEART score of 4, which puts pt in moderate risk of MACE.    10:22 PM No active CP.  Normal initial troponin.  Negative Myoview last July.  Given his risk factors as reflected by HEART score, will consult medicine for admission for CP r/o.  Pt agrees with plan.  Care discussed with DR. Mesner.   10:34 PM Appreciate consultation from hospitalist Dr. Loleta Books who agrees to see pt in the ER and will determine disposition.  11:39 PM Dr. Loleta Books has seen and evaluated pt.  He recommend delta troponin.  If negative, pt may f/u with PCP for further outpt work up.  Pt agrees with plan.    12:16 AM Normal delta trop.  Pt is CP free.  He feel comfortable f/u closely with his PCP for further care.  Return precaution discussed.    Domenic Moras, PA-C 03/24/16 0017    Merrily Pew, MD 03/24/16 [phone]

## 2016-03-23 NOTE — Consult Note (Signed)
[facility] Consult Note  Patient Name: Matthew Grant.     [id number]    DOB: [birth year]    DOA: 03/23/2016 PCP: Scarlette Calico, MD   Patient coming from: Home     Chief Complaint: Chest pain  HPI: Matthew Grant. is a 56 y.o. male with a past medical history significant for HTN who presents with chest pain.  The patient was in his usual state of health until recently, has been dealing with increased stress/anxiety related to work.  Has been having some high BP readings also vague malaise, vegetative symptoms, saw one of his PCP's associates last Friday and was started on Zoloft and had HCTZ added to Olmesartan starting Saturday.  Then Sunday he was walking around [employer] and had onset of left sided dull chest discomfort, tingling in left hand, feeling sweaty and tired and sat down until it passed.  Since, then, this sensation in his chest has returned several more times, spontaneously, sometimes in the car, sometimes sitting at rest, and resolves spontaneously.  He is able to climb stairs at home without discomfort and even did a bunch of yard work (hauling tree limbs) yesterday without chest pain, just felt more tired than usual.  Tonight the pain returned while at rest and persisted so he called EMS.  Was given aspirin 325 and NTG and pain resolved by the time of arrival.  ED course: -Heart rate 66, respirations and pulse ox normal, BP 107/74 -Initial ECG showed NSR and troponin was negative. -Na 139, K 3.6, Cr 1.1, WBC 9.2, Hgb 14.6 -CXR clear -TRH was asked to risk stratify    He has HTN and obesity, but normal lipids, no history of diabetes, does not smoke.  No personal hsitory of CV disease.  Father had MI in 23s, CABG later in life, CHF.  Patient had a POETT last year that was ambiguous but follow up NM perfusion was low risk.       Review of Systems:  Review of Systems  Constitutional: Positive for malaise/fatigue.  Cardiovascular: Positive for chest  pain and leg swelling (sock indentations only).  Neurological: Positive for tingling.  Psychiatric/Behavioral: The patient is nervous/anxious and has insomnia.   All other systems reviewed and are negative.    Past Medical History:  Diagnosis Date  . Anxiety 03/18/2016  . Diverticulosis   . Hypertension   . Kidney calculi   . Mitral valve prolapse     Past Surgical History:  Procedure Laterality Date  . KIDNEY STONE SURGERY  02-2014  . KNEE ARTHROSCOPY  1998   left  . NECK SURGERY  01-2013   herniated disc    Social History: Patient lives with his wife.  Patient walks unassisted.  He is an Agricultural consultant.  He does not smoke or use alcohol to excess.    Allergies  Allergen Reactions  . Iohexol Nausea And Vomiting     Desc: SEVERE NAUSEA AND VOMITING WITH IV CONTRAST   . Ivp Dye [Iodinated Diagnostic Agents] Nausea And Vomiting    SEVERE N/V    Family history: family history includes Cancer in his father and mother; Colon cancer in his maternal grandfather and paternal grandfather; Heart disease in his father; Hyperlipidemia in his father and mother; Hypertension in his father and mother.  Prior to Admission medications   Medication Sig Start Date End Date Taking? Authorizing Provider  aspirin 81 MG tablet Take 81 mg by mouth daily.   Yes Historical Provider, MD  olmesartan-hydrochlorothiazide (BENICAR HCT) 40-12.5 MG tablet Take 1 tablet by mouth daily. 03/18/16  Yes Biagio Borg, MD  sertraline (ZOLOFT) 50 MG tablet Take 1 tablet (50 mg total) by mouth daily. 03/18/16  Yes Biagio Borg, MD       Physical Exam: BP 127/76 (BP Location: Right Arm)   Pulse 64   Resp 22   Ht 6\' 4"  (1.93 m)   Wt 122.5 kg (270 lb)   SpO2 94%   BMI 32.87 kg/m  General appearance: Well-developed, adult male, alert and in no acute distress.   Eyes: Anicteric, conjunctiva pink, lids and lashes normal.     ENT: No nasal deformity, discharge, or epistaxis.  OP moist without lesions.     Skin: Warm and dry.   Cardiac: RRR, nl S1-S2, no murmurs appreciated.  Capillary refill is brisk.  JVP normal.  No LE edema.  Radial and DP pulses 2+ and symmetric.  No carotid bruits. Respiratory: Normal respiratory rate and rhythm.  CTAB without rales or wheezes. GI: Abdomen soft without rigidity.  No TTP. No ascites, distension.   MSK: No deformities or effusions.   Pain NOT reproduced with palpation of precordium.  No pain with arm movement. Neuro: Sensorium intact and responding to questions, attention normal.  Speech is fluent.  Moves all extremities equally and with normal coordination.    Psych: Behavior appropriate.  Affect normal.  No evidence of aural or visual hallucinations or delusions.       Labs on Admission:  The metabolic panel shows normal electrolytes and renal function. The complete blood count shows no leukocytosis, anemia or thrombocytopenia. The initial troponin is negative.  Radiological Exams on Admission: Personally reviewed CXR clear: Dg Chest Port 1 View  Result Date: 03/23/2016 CLINICAL DATA:  Chest pain. Shortness of breath. Nausea. Left hand tingling. EXAM: PORTABLE CHEST 1 VIEW COMPARISON:  None. FINDINGS: The heart size is exaggerated by low lung volumes. Minimal bibasilar atelectasis is present. There is no focal airspace disease. Cervical fusion is noted. IMPRESSION: 1. Low lung volumes. 2. No acute cardiopulmonary disease. Electronically Signed   By: San Morelle M.D.   On: 03/23/2016 20:13    EKG: Independently reviewed. Rate 69, QTc 450, normal sinus rhyhtm without ST changes.    Assessment/Plan  1. Chest pain: The patient has HEART score of 3. Angina is atypical, nonexertional, ACS is unlikely.  Other potential causes of chest pain (PE, dissection, pancreatitis, pneumonia/effusion, pericarditis) are doubted.  Side effect of sertraline is considered. -Repeat 3 hour troponin -If abnormal, admit for trend -If negative, patient should be  discharged with return precautions and PCP follow up and outpatient stress perhaps for further risk stratification  2. Hypertension: Controlled on Benicar and new HCTZ   Edwin Dada [facility] Pager [phone]

## 2016-03-24 LAB — I-STAT TROPONIN, ED: TROPONIN I, POC: 0 ng/mL (ref 0.00–0.08)

## 2016-03-24 NOTE — Discharge Instructions (Signed)
Please call and follow up with your primary care provider for further evaluation of your chest pain.  You may benefit from a cardiac stress test outpatient. Return promptly if your condition worsen or if you have other concerns.

## 2016-03-28 ENCOUNTER — Ambulatory Visit (INDEPENDENT_AMBULATORY_CARE_PROVIDER_SITE_OTHER): Payer: 59 | Admitting: Internal Medicine

## 2016-03-28 ENCOUNTER — Encounter: Payer: Self-pay | Admitting: Internal Medicine

## 2016-03-28 ENCOUNTER — Other Ambulatory Visit (INDEPENDENT_AMBULATORY_CARE_PROVIDER_SITE_OTHER): Payer: 59

## 2016-03-28 VITALS — BP 114/76 | HR 92 | Temp 98.5°F | Resp 16 | Ht 76.0 in | Wt 267.5 lb

## 2016-03-28 DIAGNOSIS — K2100 Gastro-esophageal reflux disease with esophagitis, without bleeding: Secondary | ICD-10-CM | POA: Insufficient documentation

## 2016-03-28 DIAGNOSIS — R5383 Other fatigue: Secondary | ICD-10-CM | POA: Insufficient documentation

## 2016-03-28 DIAGNOSIS — I10 Essential (primary) hypertension: Secondary | ICD-10-CM | POA: Diagnosis not present

## 2016-03-28 DIAGNOSIS — M940 Chondrocostal junction syndrome [Tietze]: Secondary | ICD-10-CM | POA: Insufficient documentation

## 2016-03-28 LAB — COMPREHENSIVE METABOLIC PANEL
ALBUMIN: 4.2 g/dL (ref 3.5–5.2)
ALK PHOS: 71 U/L (ref 39–117)
ALT: 33 U/L (ref 0–53)
AST: 22 U/L (ref 0–37)
BILIRUBIN TOTAL: 0.6 mg/dL (ref 0.2–1.2)
BUN: 16 mg/dL (ref 6–23)
CALCIUM: 9 mg/dL (ref 8.4–10.5)
CO2: 31 mEq/L (ref 19–32)
Chloride: 103 mEq/L (ref 96–112)
Creatinine, Ser: 1.15 mg/dL (ref 0.40–1.50)
GFR: 70.25 mL/min (ref >60.00)
GLUCOSE: 80 mg/dL (ref 70–99)
POTASSIUM: 4 meq/L (ref 3.5–5.1)
Sodium: 141 mEq/L (ref 135–145)
TOTAL PROTEIN: 6.8 g/dL (ref 6.0–8.3)

## 2016-03-28 LAB — THYROID PANEL WITH TSH
Free Thyroxine Index: 2.7 (ref 1.4–3.8)
T3 Uptake: 29 % (ref 22–35)
T4, Total: 9.2 ug/dL (ref 4.5–12.0)
TSH: 0.72 mIU/L (ref 0.40–4.50)

## 2016-03-28 LAB — PHOSPHORUS: Phosphorus: 3.4 mg/dL (ref 2.3–4.6)

## 2016-03-28 LAB — MAGNESIUM: Magnesium: 2.1 mg/dL (ref 1.5–2.5)

## 2016-03-28 MED ORDER — DEXLANSOPRAZOLE 60 MG PO CPDR: 60.0000 mg | DELAYED_RELEASE_CAPSULE | Freq: Every day | ORAL | 1 refills | Status: DC

## 2016-03-28 MED ORDER — NORTRIPTYLINE HCL 10 MG PO CAPS: 10.0000 mg | ORAL_CAPSULE | Freq: Every day | ORAL | 1 refills | Status: DC

## 2016-03-28 NOTE — Progress Notes (Signed)
Subjective:  Patient ID: Matthew Grant., male    DOB: [birth year]  Age: 56 y.o. MRN: [id number]  CC: Hospitalization Follow-up (chest pain SOB, Dizziness)   HPI Matthew Grant. presents for Follow-up after a recent visit to the emergency room for a vague sensation of chest pain and shortness of breath. The pain is a diffuse achy sensation and intermittently sharp sensation around his sternum. He complains of severe fatigue that has been worsening over the last year. He was recently seen by someone else for concerns about snoring and possible sleep apnea. He also has hypersomnolence. He was referred to sleep medicine but has not been seen yet. He was seen in emergency room and ruled out for cardiopulmonary disease. His lab work was marker remarkable only for mild hypocalcemia. He also complains of severe heartburn but denies odynophagia or dysphagia.  Outpatient Medications Prior to Visit  Medication Sig Dispense Refill  . aspirin 81 MG tablet Take 81 mg by mouth daily.    . sertraline (ZOLOFT) 50 MG tablet Take 1 tablet (50 mg total) by mouth daily. 30 tablet 3  . olmesartan-hydrochlorothiazide (BENICAR HCT) 40-12.5 MG tablet Take 1 tablet by mouth daily. 90 tablet 3   No facility-administered medications prior to visit.     ROS Review of Systems  Constitutional: Positive for fatigue. Negative for activity change, appetite change, chills, diaphoresis, fever and unexpected weight change.  HENT: Negative.  Negative for congestion, sinus pressure, sore throat, trouble swallowing and voice change.   Eyes: Negative.  Negative for visual disturbance.  Respiratory: Negative.  Negative for cough, chest tightness, shortness of breath, wheezing and stridor.   Cardiovascular: Positive for chest pain.  Gastrointestinal: Negative.  Negative for abdominal pain, constipation, diarrhea, nausea and vomiting.  Endocrine: Negative.   Genitourinary: Negative.  Negative for decreased urine volume,  difficulty urinating and urgency.  Musculoskeletal: Negative.  Negative for back pain, myalgias and neck pain.  Skin: Negative.   Allergic/Immunologic: Negative.   Neurological: Negative.  Negative for dizziness, weakness and numbness.  Hematological: Negative for adenopathy. Does not bruise/bleed easily.  Psychiatric/Behavioral: Negative.  Negative for dysphoric mood and sleep disturbance. The patient is not nervous/anxious.     Objective:  BP 114/76   Pulse 92   Temp 98.5 F (36.9 C) (Oral)   Resp 16   Ht 6\' 4"  (1.93 m)   Wt 267 lb 8 oz (121.3 kg)   SpO2 98%   BMI 32.56 kg/m   BP Readings from Last 3 Encounters:  03/28/16 114/76  03/24/16 115/80  03/18/16 132/82    Wt Readings from Last 3 Encounters:  03/28/16 267 lb 8 oz (121.3 kg)  03/23/16 270 lb (122.5 kg)  03/18/16 270 lb (122.5 kg)    Physical Exam  Constitutional: He is oriented to person, place, and time.  Non-toxic appearance. He does not have a sickly appearance. He does not appear ill. No distress.  HENT:  Mouth/Throat: Oropharynx is clear and moist. No oropharyngeal exudate.  Eyes: Conjunctivae are normal. Right eye exhibits no discharge. Left eye exhibits no discharge. No scleral icterus.  Neck: Normal range of motion. Neck supple. No JVD present. No tracheal deviation present. No thyromegaly present.  Cardiovascular: Normal rate, regular rhythm, normal heart sounds and intact distal pulses.  Exam reveals no gallop and no friction rub.   No murmur heard. Pulmonary/Chest: Effort normal and breath sounds normal. No stridor. No respiratory distress. He has no wheezes. He has no rales. He  exhibits no tenderness.  Abdominal: Soft. Bowel sounds are normal. He exhibits no distension and no mass. There is no tenderness. There is no rebound and no guarding.  Musculoskeletal: Normal range of motion. He exhibits no edema, tenderness or deformity.  Lymphadenopathy:    He has no cervical adenopathy.  Neurological: He  is oriented to person, place, and time.  Skin: Skin is warm and dry. No rash noted. He is not diaphoretic. No erythema. No pallor.  Psychiatric: He has a normal mood and affect. His behavior is normal. Judgment and thought content normal.  Vitals reviewed.   Lab Results  Component Value Date   WBC 9.2 03/23/2016   HGB 14.6 03/23/2016   HCT 43.8 03/23/2016   PLT 218 03/23/2016   GLUCOSE 80 03/28/2016   CHOL 174 05/14/2015   TRIG 96.0 05/14/2015   HDL 43.20 05/14/2015   LDLCALC 111 (H) 05/14/2015   ALT 33 03/28/2016   AST 22 03/28/2016   NA 141 03/28/2016   K 4.0 03/28/2016   CL 103 03/28/2016   CREATININE 1.15 03/28/2016   BUN 16 03/28/2016   CO2 31 03/28/2016   TSH 0.72 03/28/2016   PSA 1.02 05/14/2015    Dg Chest Port 1 View  Result Date: 03/23/2016 CLINICAL DATA:  Chest pain. Shortness of breath. Nausea. Left hand tingling. EXAM: PORTABLE CHEST 1 VIEW COMPARISON:  None. FINDINGS: The heart size is exaggerated by low lung volumes. Minimal bibasilar atelectasis is present. There is no focal airspace disease. Cervical fusion is noted. IMPRESSION: 1. Low lung volumes. 2. No acute cardiopulmonary disease. Electronically Signed   By: San Morelle M.D.   On: 03/23/2016 20:13    Assessment & Plan:   Jerauld was seen today for hospitalization follow-up.  Diagnoses and all orders for this visit:  Essential hypertension, benign- his blood pressure is well-controlled, electrolytes and renal function are normal. -     Comprehensive metabolic panel; Future  Fatigue, unspecified type- his TSH and other labs are normal, I am concerned that his fatigue is related to a sleep disorder and have encouraged him to follow-up with sleep medicine as previously directed. -     Thyroid Panel With TSH; Future  Hypocalcemia- his calcium level is normal now but his PTH is slightly elevated, I will continue to monitor him for the development of hyperparathyroidism and other calcium related  disorders. -     Comprehensive metabolic panel; Future -     Magnesium; Future -     Phosphorus; Future -     PTH, intact and calcium; Future  GERD with esophagitis- this may be causing the chest pain so I have asked him to take a potent PPI -     dexlansoprazole (DEXILANT) 60 MG capsule; Take 1 capsule (60 mg total) by mouth daily.  Costochondritis- this may be causing his chest pain so Avastin to start taking nortriptyline -     nortriptyline (PAMELOR) 10 MG capsule; Take 1 capsule (10 mg total) by mouth at bedtime.   I have discontinued Mr. Guzzo's olmesartan-hydrochlorothiazide. I am also having him start on dexlansoprazole and nortriptyline. Additionally, I am having him maintain his aspirin and sertraline.  Meds ordered this encounter  Medications  . dexlansoprazole (DEXILANT) 60 MG capsule    Sig: Take 1 capsule (60 mg total) by mouth daily.    Dispense:  90 capsule    Refill:  1  . nortriptyline (PAMELOR) 10 MG capsule    Sig: Take 1 capsule (10  mg total) by mouth at bedtime.    Dispense:  90 capsule    Refill:  1     Follow-up: Return in about 3 weeks (around 04/18/2016).  Scarlette Calico, MD

## 2016-03-28 NOTE — Patient Instructions (Signed)
Costochondritis Costochondritis is swelling and irritation (inflammation) of the tissue (cartilage) that connects your ribs to your breastbone (sternum). This causes pain in the front of your chest. The pain usually starts gradually and involves more than one rib. What are the causes? The exact cause of this condition is not always known. It results from stress on the cartilage where your ribs attach to your sternum. The cause of this stress could be:  Chest injury (trauma).  Exercise or activity, such as lifting.  Severe coughing. What increases the risk? You may be at higher risk for this condition if you:  Are male.  Are 30?55 years old.  Recently started a new exercise or work activity.  Have low levels of vitamin D.  Have a condition that makes you cough frequently. What are the signs or symptoms? The main symptom of this condition is chest pain. The pain:  Usually starts gradually and can be sharp or dull.  Gets worse with deep breathing, coughing, or exercise.  Gets better with rest.  May be worse when you press on the sternum-rib connection (tenderness). How is this diagnosed? This condition is diagnosed based on your symptoms, medical history, and a physical exam. Your health care provider will check for tenderness when pressing on your sternum. This is the most important finding. You may also have tests to rule out other causes of chest pain. These may include:  A chest X-ray to check for lung problems.  An electrocardiogram (ECG) to see if you have a heart problem that could be causing the pain.  An imaging scan to rule out a chest or rib fracture. How is this treated? This condition usually goes away on its own over time. Your health care provider may prescribe an NSAID to reduce pain and inflammation. Your health care provider may also suggest that you:  Rest and avoid activities that make pain worse.  Apply heat or cold to the area to reduce pain and  inflammation.  Do exercises to stretch your chest muscles. If these treatments do not help, your health care provider may inject a numbing medicine at the sternum-rib connection to help relieve the pain. Follow these instructions at home:  Avoid activities that make pain worse. This includes any activities that use chest, abdominal, and side muscles.  If directed, put ice on the painful area:  Put ice in a plastic bag.  Place a towel between your skin and the bag.  Leave the ice on for 20 minutes, 2-3 times a day.  If directed, apply heat to the affected area as often as told by your health care provider. Use the heat source that your health care provider recommends, such as a moist heat pack or a heating pad.  Place a towel between your skin and the heat source.  Leave the heat on for 20-30 minutes.  Remove the heat if your skin turns bright red. This is especially important if you are unable to feel pain, heat, or cold. You may have a greater risk of getting burned.  Take over-the-counter and prescription medicines only as told by your health care provider.  Return to your normal activities as told by your health care provider. Ask your health care provider what activities are safe for you.  Keep all follow-up visits as told by your health care provider. This is important. Contact a health care provider if:  You have chills or a fever.  Your pain does not go away or it gets worse.    You have a cough that does not go away (is persistent). Get help right away if:  You have shortness of breath. This information is not intended to replace advice given to you by your health care provider. Make sure you discuss any questions you have with your health care provider. Document Released: 10/20/2004 Document Revised: 07/31/2015 Document Reviewed: 05/06/2015 Elsevier Interactive Patient Education  2017 [employer]  

## 2016-03-28 NOTE — Progress Notes (Signed)
Pre-visit discussion using our clinic review tool. No additional management support is needed unless otherwise documented below in the visit note.  

## 2016-03-29 ENCOUNTER — Encounter: Payer: Self-pay | Admitting: Internal Medicine

## 2016-03-29 LAB — PTH, INTACT AND CALCIUM
CALCIUM: 9.2 mg/dL (ref 8.6–10.3)
PTH: 81 pg/mL — AB (ref 14–64)

## 2016-03-31 ENCOUNTER — Encounter: Payer: Self-pay | Admitting: Internal Medicine

## 2016-04-06 ENCOUNTER — Other Ambulatory Visit: Payer: Self-pay | Admitting: Internal Medicine

## 2016-04-06 ENCOUNTER — Encounter: Payer: Self-pay | Admitting: Internal Medicine

## 2016-04-06 DIAGNOSIS — I10 Essential (primary) hypertension: Secondary | ICD-10-CM

## 2016-04-06 DIAGNOSIS — K2100 Gastro-esophageal reflux disease with esophagitis, without bleeding: Secondary | ICD-10-CM

## 2016-04-06 MED ORDER — ESOMEPRAZOLE MAGNESIUM 40 MG PO CPDR: 40.0000 mg | DELAYED_RELEASE_CAPSULE | Freq: Every day | ORAL | 3 refills | Status: DC

## 2016-04-06 MED ORDER — TELMISARTAN 40 MG PO TABS: 40.0000 mg | ORAL_TABLET | Freq: Every day | ORAL | 1 refills | Status: DC

## 2016-05-11 ENCOUNTER — Ambulatory Visit (HOSPITAL_COMMUNITY)
Admission: EM | Admit: 2016-05-11 | Discharge: 2016-05-11 | Disposition: A | Discharge status: Home / Self Care | Payer: 59 | Attending: Family Medicine | Admitting: Family Medicine

## 2016-05-11 ENCOUNTER — Encounter (HOSPITAL_COMMUNITY): Payer: Self-pay | Admitting: Emergency Medicine

## 2016-05-11 DIAGNOSIS — M7021 Olecranon bursitis, right elbow: Secondary | ICD-10-CM

## 2016-05-11 MED ORDER — TRIAMCINOLONE ACETONIDE 40 MG/ML IJ SUSP
INTRAMUSCULAR | Status: AC
  Filled 2016-05-11: qty 1

## 2016-05-11 MED ORDER — NAPROXEN 500 MG PO TABS: 500.0000 mg | ORAL_TABLET | Freq: Two times a day (BID) | ORAL | 0 refills | Status: DC

## 2016-05-11 NOTE — ED Provider Notes (Signed)
CSN: [id number]     Arrival date & time 05/11/16  1736 History   None    Chief Complaint  Patient presents with  . Joint Swelling    right elbow   (Consider location/radiation/quality/duration/timing/severity/associated sxs/prior Treatment) Patient has right elbow swelling and tenderness.  He was out doing some yard work and some lifting yesterday.   The history is provided by the patient.  Arm Injury  Location:  Elbow Elbow location:  R elbow Injury: no   Pain details:    Quality:  Aching   Radiates to:  Does not radiate   Severity:  Moderate   Onset quality:  Sudden   Duration:  1 day   Timing:  Constant   Progression:  Worsening Handedness:  Right-handed Dislocation: no   Foreign body present:  No foreign bodies Tetanus status:  Up to date Prior injury to area:  No Relieved by:  Nothing Worsened by:  Nothing Ineffective treatments:  None tried   Past Medical History:  Diagnosis Date  . Anxiety 03/18/2016  . Diverticulosis   . Hypertension   . Kidney calculi   . Mitral valve prolapse    Past Surgical History:  Procedure Laterality Date  . KIDNEY STONE SURGERY  02-2014  . KNEE ARTHROSCOPY  1998   left  . NECK SURGERY  01-2013   herniated disc   Family History  Problem Relation Age of Onset  . Cancer Father   . Hypertension Father   . Hyperlipidemia Father   . Heart disease Father   . Cancer Mother   . Hypertension Mother   . Hyperlipidemia Mother   . Colon cancer Maternal Grandfather   . Colon cancer Paternal Grandfather   . Stomach cancer Neg Hx    Social History  Substance Use Topics  . Smoking status: Never Smoker  . Smokeless tobacco: Never Used  . Alcohol use No    Review of Systems  Constitutional: Negative.   Eyes: Negative.   Respiratory: Negative.   Cardiovascular: Negative.   Gastrointestinal: Negative.   Endocrine: Negative.   Genitourinary: Negative.   Musculoskeletal: Positive for arthralgias.  Skin: Negative.    Allergic/Immunologic: Negative.   Neurological: Negative.   Hematological: Negative.   Psychiatric/Behavioral: Negative.     Allergies  Iohexol and Ivp dye [iodinated diagnostic agents]  Home Medications   Prior to Admission medications   Medication Sig Start Date End Date Taking? Authorizing Provider  aspirin 81 MG tablet Take 81 mg by mouth daily.    Historical Provider, MD  esomeprazole (NEXIUM) 40 MG capsule Take 1 capsule (40 mg total) by mouth daily at 12 noon. 04/06/16   Janith Lima, MD  naproxen (NAPROSYN) 500 MG tablet Take 1 tablet (500 mg total) by mouth 2 (two) times daily with a meal. 05/11/16   Lysbeth Penner, FNP  nortriptyline (PAMELOR) 10 MG capsule Take 1 capsule (10 mg total) by mouth at bedtime. 03/28/16   Janith Lima, MD  sertraline (ZOLOFT) 50 MG tablet Take 1 tablet (50 mg total) by mouth daily. 03/18/16   Biagio Borg, MD  telmisartan (MICARDIS) 40 MG tablet Take 1 tablet (40 mg total) by mouth daily. 04/06/16   Janith Lima, MD   Meds Ordered and Administered this Visit  Medications - No data to display  BP (!) 133/91 (BP Location: Right Arm)   Pulse 69   Temp 98.5 F (36.9 C) (Oral)   Resp 17   Ht 6\' 4"  (1.93 m)  Wt 250 lb (113.4 kg)   SpO2 99%   BMI 30.43 kg/m  No data found.   Physical Exam  Constitutional: He appears well-developed and well-nourished.  HENT:  Head: Normocephalic and atraumatic.  Eyes: Conjunctivae and EOM are normal. Pupils are equal, round, and reactive to light.  Neck: Normal range of motion. Neck supple.  Cardiovascular: Normal rate, regular rhythm and normal heart sounds.   Pulmonary/Chest: Effort normal.  Musculoskeletal: He exhibits tenderness.  Right olecranon bursitis and effusion  Nursing note and vitals reviewed.   Urgent Care Course     .Joint Aspiration/Arthrocentesis Date/Time: 05/11/2016 6:56 PM Performed by: Lysbeth Penner Authorized by: Robyn Haber   Consent:    Consent obtained:   Verbal   Consent given by:  Patient   Risks discussed:  Bleeding, infection and pain Location:    Location:  Elbow   Elbow:  R elbow Anesthesia (see MAR for exact dosages):    Anesthesia method:  Topical application   Topical anesthetic:  LET Procedure details:    Preparation: Patient was prepped and draped in usual sterile fashion     Needle gauge:  39 G   Ultrasound guidance: no     Approach:  Superior   Aspirate amount:  14 ml   Aspirate characteristics:  Blood-tinged   Steroid injected: yes     Specimen collected: no   Post-procedure details:    Dressing:  Adhesive bandage   Patient tolerance of procedure:  Tolerated well, no immediate complications   (including critical care time)  Labs Review Labs Reviewed - No data to display  Imaging Review No results found.   Visual Acuity Review  Right Eye Distance:   Left Eye Distance:   Bilateral Distance:    Right Eye Near:   Left Eye Near:    Bilateral Near:         MDM   1. Olecranon bursitis of right elbow    Right olecranon bursae effusion aspiration and injection with 40mg  Kenalog      Lysbeth Penner, FNP 05/11/16 1857

## 2016-05-11 NOTE — ED Triage Notes (Signed)
Pt. Stated, I have a swollen left elbow.  I have been lifting moving things.

## 2016-05-16 ENCOUNTER — Other Ambulatory Visit (INDEPENDENT_AMBULATORY_CARE_PROVIDER_SITE_OTHER): Payer: 59

## 2016-05-16 ENCOUNTER — Encounter: Payer: Self-pay | Admitting: Internal Medicine

## 2016-05-16 ENCOUNTER — Ambulatory Visit (INDEPENDENT_AMBULATORY_CARE_PROVIDER_SITE_OTHER): Payer: 59 | Admitting: Internal Medicine

## 2016-05-16 VITALS — BP 130/88 | HR 60 | Temp 98.6°F | Resp 16 | Ht 76.0 in | Wt 264.2 lb

## 2016-05-16 DIAGNOSIS — I10 Essential (primary) hypertension: Secondary | ICD-10-CM

## 2016-05-16 DIAGNOSIS — Z0000 Encounter for general adult medical examination without abnormal findings: Secondary | ICD-10-CM | POA: Diagnosis not present

## 2016-05-16 DIAGNOSIS — H6123 Impacted cerumen, bilateral: Secondary | ICD-10-CM | POA: Diagnosis not present

## 2016-05-16 LAB — PSA: PSA: 1.16 ng/mL (ref 0.10–4.00)

## 2016-05-16 LAB — LIPID PANEL
CHOL/HDL RATIO: 4
Cholesterol: 155 mg/dL (ref 0–200)
HDL: 43.8 mg/dL (ref >39.00)
LDL CALC: 96 mg/dL (ref 0–99)
NonHDL: 110.84
TRIGLYCERIDES: 76 mg/dL (ref 0.0–149.0)
VLDL: 15.2 mg/dL (ref 0.0–40.0)

## 2016-05-16 LAB — BASIC METABOLIC PANEL
BUN: 19 mg/dL (ref 6–23)
CALCIUM: 9.3 mg/dL (ref 8.4–10.5)
CO2: 29 mEq/L (ref 19–32)
Chloride: 104 mEq/L (ref 96–112)
Creatinine, Ser: 1 mg/dL (ref 0.40–1.50)
GFR: 82.5 mL/min (ref >60.00)
GLUCOSE: 95 mg/dL (ref 70–99)
Potassium: 4.6 mEq/L (ref 3.5–5.1)
SODIUM: 140 meq/L (ref 135–145)

## 2016-05-16 NOTE — Patient Instructions (Signed)
 Health Maintenance, Male A healthy lifestyle and preventive care is important for your health and wellness. Ask your health care provider about what schedule of regular examinations is right for you. What should I know about weight and diet?  Eat a Healthy Diet  Eat plenty of vegetables, fruits, whole grains, low-fat dairy products, and lean protein.  Do not eat a lot of foods high in solid fats, added sugars, or salt. Maintain a Healthy Weight  Regular exercise can help you achieve or maintain a healthy weight. You should:  Do at least 150 minutes of exercise each week. The exercise should increase your heart rate and make you sweat (moderate-intensity exercise).  Do strength-training exercises at least twice a week. Watch Your Levels of Cholesterol and Blood Lipids  Have your blood tested for lipids and cholesterol every 5 years starting at 55 years of age. If you are at high risk for heart disease, you should start having your blood tested when you are 55 years old. You may need to have your cholesterol levels checked more often if:  Your lipid or cholesterol levels are high.  You are older than 55 years of age.  You are at high risk for heart disease. What should I know about cancer screening? Many types of cancers can be detected early and may often be prevented. Lung Cancer  You should be screened every year for lung cancer if:  You are a current smoker who has smoked for at least 30 years.  You are a former smoker who has quit within the past 15 years.  Talk to your health care provider about your screening options, when you should start screening, and how often you should be screened. Colorectal Cancer  Routine colorectal cancer screening usually begins at 55 years of age and should be repeated every 5-10 years until you are 55 years old. You may need to be screened more often if early forms of precancerous polyps or small growths are found. Your health care provider  may recommend screening at an earlier age if you have risk factors for colon cancer.  Your health care provider may recommend using home test kits to check for hidden blood in the stool.  A small camera at the end of a tube can be used to examine your colon (sigmoidoscopy or colonoscopy). This checks for the earliest forms of colorectal cancer. Prostate and Testicular Cancer  Depending on your age and overall health, your health care provider may do certain tests to screen for prostate and testicular cancer.  Talk to your health care provider about any symptoms or concerns you have about testicular or prostate cancer. Skin Cancer  Check your skin from head to toe regularly.  Tell your health care provider about any new moles or changes in moles, especially if:  There is a change in a mole's size, shape, or color.  You have a mole that is larger than a pencil eraser.  Always use sunscreen. Apply sunscreen liberally and repeat throughout the day.  Protect yourself by wearing long sleeves, pants, a wide-brimmed hat, and sunglasses when outside. What should I know about heart disease, diabetes, and high blood pressure?  If you are 18-39 years of age, have your blood pressure checked every 3-5 years. If you are 40 years of age or older, have your blood pressure checked every year. You should have your blood pressure measured twice-once when you are at a hospital or clinic, and once when you are not at   a hospital or clinic. Record the average of the two measurements. To check your blood pressure when you are not at a hospital or clinic, you can use:  An automated blood pressure machine at a pharmacy.  A home blood pressure monitor.  Talk to your health care provider about your target blood pressure.  If you are between 45-79 years old, ask your health care provider if you should take aspirin to prevent heart disease.  Have regular diabetes screenings by checking your fasting blood sugar  level.  If you are at a normal weight and have a low risk for diabetes, have this test once every three years after the age of 45.  If you are overweight and have a high risk for diabetes, consider being tested at a younger age or more often.  A one-time screening for abdominal aortic aneurysm (AAA) by ultrasound is recommended for men aged 65-75 years who are current or former smokers. What should I know about preventing infection? Hepatitis B  If you have a higher risk for hepatitis B, you should be screened for this virus. Talk with your health care provider to find out if you are at risk for hepatitis B infection. Hepatitis C  Blood testing is recommended for:  Everyone born from 1945 through 1965.  Anyone with known risk factors for hepatitis C. Sexually Transmitted Diseases (STDs)  You should be screened each year for STDs including gonorrhea and chlamydia if:  You are sexually active and are younger than 55 years of age.  You are older than 55 years of age and your health care provider tells you that you are at risk for this type of infection.  Your sexual activity has changed since you were last screened and you are at an increased risk for chlamydia or gonorrhea. Ask your health care provider if you are at risk.  Talk with your health care provider about whether you are at high risk of being infected with HIV. Your health care provider may recommend a prescription medicine to help prevent HIV infection. What else can I do?  Schedule regular health, dental, and eye exams.  Stay current with your vaccines (immunizations).  Do not use any tobacco products, such as cigarettes, chewing tobacco, and e-cigarettes. If you need help quitting, ask your health care provider.  Limit alcohol intake to no more than 2 drinks per day. One drink equals 12 ounces of beer, 5 ounces of wine, or 1 ounces of hard liquor.  Do not use street drugs.  Do not share needles.  Ask your health  care provider for help if you need support or information about quitting drugs.  Tell your health care provider if you often feel depressed.  Tell your health care provider if you have ever been abused or do not feel safe at home. This information is not intended to replace advice given to you by your health care provider. Make sure you discuss any questions you have with your health care provider. Document Released: 07/09/2007 Document Revised: 09/09/2015 Document Reviewed: 10/14/2014 Elsevier Interactive Patient Education  2017 [employer]  

## 2016-05-16 NOTE — Progress Notes (Signed)
Pre visit review using our clinic review tool, if applicable. No additional management support is needed unless otherwise documented below in the visit note. 

## 2016-05-16 NOTE — Progress Notes (Signed)
Subjective:  Patient ID: Matthew Durie., male    DOB: 11/13/1960  Age: 56 y.o. MRN: [id number]  CC: Annual Exam and Hypertension   HPI Matthew Durie. presents for a CPX.  He complains of decreased hearing in both ears. He was recently seen in an [facility] for right elbow swelling and had fluid drained, he tells that the elbow feels much better. His BP has been well controlled, he has had no recent episodes of CP/DOE/SOB.  Outpatient Medications Prior to Visit  Medication Sig Dispense Refill  . aspirin 81 MG tablet Take 81 mg by mouth daily.    Marland Kitchen telmisartan (MICARDIS) 40 MG tablet Take 1 tablet (40 mg total) by mouth daily. 90 tablet 1  . esomeprazole (NEXIUM) 40 MG capsule Take 1 capsule (40 mg total) by mouth daily at 12 noon. 90 capsule 3  . naproxen (NAPROSYN) 500 MG tablet Take 1 tablet (500 mg total) by mouth 2 (two) times daily with a meal. 14 tablet 0  . nortriptyline (PAMELOR) 10 MG capsule Take 1 capsule (10 mg total) by mouth at bedtime. 90 capsule 1  . sertraline (ZOLOFT) 50 MG tablet Take 1 tablet (50 mg total) by mouth daily. 30 tablet 3   No facility-administered medications prior to visit.     ROS Review of Systems  Constitutional: Negative.  Negative for activity change, appetite change, diaphoresis, fatigue and unexpected weight change.  HENT: Positive for hearing loss. Negative for ear discharge, ear pain, facial swelling, sinus pressure and sore throat.   Eyes: Negative.  Negative for visual disturbance.  Respiratory: Negative for cough, chest tightness, shortness of breath and wheezing.   Cardiovascular: Negative for chest pain, palpitations and leg swelling.  Gastrointestinal: Negative for abdominal pain, constipation, diarrhea, nausea and vomiting.  Endocrine: Negative.   Genitourinary: Negative.  Negative for decreased urine volume, difficulty urinating, flank pain, frequency, penile swelling, scrotal swelling and testicular pain.  Musculoskeletal:  Negative.  Negative for back pain and myalgias.  Allergic/Immunologic: Negative.   Neurological: Negative.   Hematological: Negative for adenopathy. Does not bruise/bleed easily.  Psychiatric/Behavioral: Negative.     Objective:  BP 130/88 (BP Location: Left Arm, Patient Position: Sitting, Cuff Size: Large)   Pulse 60   Temp 98.6 F (37 C) (Oral)   Resp 16   Ht 6\' 4"  (1.93 m)   Wt 264 lb 4 oz (119.9 kg)   SpO2 99%   BMI 32.17 kg/m   BP Readings from Last 3 Encounters:  05/16/16 130/88  05/11/16 (!) 133/91  03/28/16 114/76    Wt Readings from Last 3 Encounters:  05/16/16 264 lb 4 oz (119.9 kg)  05/11/16 250 lb (113.4 kg)  03/28/16 267 lb 8 oz (121.3 kg)    Physical Exam  Constitutional: He is oriented to person, place, and time. No distress.  HENT:  Right Ear: Hearing, tympanic membrane and external ear normal. No swelling. A foreign body (cerumen impaction) is present.  Left Ear: Hearing, tympanic membrane and external ear normal. No swelling. A foreign body (cerumen impaction) is present.  Mouth/Throat: Oropharynx is clear and moist. No oropharyngeal exudate.  I put Colace in both ears and then I irrigated it and used an ear pick to remove the cerumen. He tolerated this well and tells me that his hearing is normal now. The examination shows that the cerumen is been removed in his EAC is normal.  Eyes: Conjunctivae are normal. Right eye exhibits no discharge. Left eye exhibits  no discharge. No scleral icterus.  Neck: Normal range of motion. Neck supple. No JVD present. No tracheal deviation present. No thyromegaly present.  Cardiovascular: Normal rate, regular rhythm, normal heart sounds and intact distal pulses.  Exam reveals no gallop and no friction rub.   No murmur heard. Pulmonary/Chest: Effort normal and breath sounds normal. No stridor. No respiratory distress. He has no wheezes. He has no rales. He exhibits no tenderness.  Abdominal: Soft. Bowel sounds are normal.  He exhibits no distension and no mass. There is no tenderness. There is no rebound and no guarding. Hernia confirmed negative in the right inguinal area and confirmed negative in the left inguinal area.  Genitourinary: Rectum normal, prostate normal, testes normal and penis normal. Rectal exam shows no external hemorrhoid, no internal hemorrhoid, no fissure, no mass, no tenderness, anal tone normal and guaiac negative stool. Prostate is not enlarged and not tender. Right testis shows no mass, no swelling and no tenderness. Right testis is descended. Left testis shows no mass, no swelling and no tenderness. Left testis is descended. Circumcised. No penile erythema or penile tenderness. No discharge found.  Musculoskeletal: Normal range of motion. He exhibits no edema, tenderness or deformity.  Lymphadenopathy:    He has no cervical adenopathy.       Right: No inguinal adenopathy present.       Left: No inguinal adenopathy present.  Neurological: He is oriented to person, place, and time.  Skin: Skin is warm and dry. No rash noted. He is not diaphoretic. No erythema. No pallor.  Psychiatric: He has a normal mood and affect. His behavior is normal. Judgment and thought content normal.  Vitals reviewed.   Lab Results  Component Value Date   WBC 9.2 03/23/2016   HGB 14.6 03/23/2016   HCT 43.8 03/23/2016   PLT 218 03/23/2016   GLUCOSE 95 05/16/2016   CHOL 155 05/16/2016   TRIG 76.0 05/16/2016   HDL 43.80 05/16/2016   LDLCALC 96 05/16/2016   ALT 33 03/28/2016   AST 22 03/28/2016   NA 140 05/16/2016   K 4.6 05/16/2016   CL 104 05/16/2016   CREATININE 1.00 05/16/2016   BUN 19 05/16/2016   CO2 29 05/16/2016   TSH 0.72 03/28/2016   PSA 1.16 05/16/2016    No results found.  Assessment & Plan:   Matthew Grant was seen today for annual exam and hypertension.  Diagnoses and all orders for this visit:  Routine general medical examination at a health care facility -     Lipid panel; Future -      PSA; Future  Essential hypertension, benign -     Basic metabolic panel; Future   I have discontinued Mr. Matthew Grant's sertraline, nortriptyline, esomeprazole, and naproxen. I am also having him maintain his aspirin and telmisartan.  No orders of the defined types were placed in this encounter.    Follow-up: Return in about 6 months (around 11/15/2016).  Scarlette Calico, MD

## 2016-05-23 ENCOUNTER — Ambulatory Visit (INDEPENDENT_AMBULATORY_CARE_PROVIDER_SITE_OTHER): Payer: 59 | Admitting: Internal Medicine

## 2016-05-23 ENCOUNTER — Encounter: Payer: Self-pay | Admitting: Internal Medicine

## 2016-05-23 VITALS — BP 114/72 | HR 68 | Ht 76.0 in | Wt 262.4 lb

## 2016-05-23 DIAGNOSIS — G4733 Obstructive sleep apnea (adult) (pediatric): Secondary | ICD-10-CM | POA: Diagnosis not present

## 2016-05-23 DIAGNOSIS — F5101 Primary insomnia: Secondary | ICD-10-CM | POA: Diagnosis not present

## 2016-05-23 MED ORDER — ZALEPLON 10 MG PO CAPS: 10.0000 mg | ORAL_CAPSULE | Freq: Every evening | ORAL | 2 refills | Status: DC | PRN

## 2016-05-23 NOTE — Progress Notes (Signed)
05/23/16- 56 year old male never smoker referred courtesy of Dr Cathlean Cower. Not sleeping well; waking up through the night. Never had sleep study. Medical problems include dyspnea on exertion, HBP, GERD Works as Agricultural consultant. Active and moving around through the day with no night call responsibility. Wife is here. They had been living in an older home but moved into new home with new bed only a month ago. Wife says since the move, he has slept much more quietly with much less snoring-previously very loud. He finds he is sleeping better. Bedtime is between 10 and 11 PM with latency maybe 20 minutes up multiple times before finally up at 7 AM. He still tends to wake pretty routinely around 2 or 3 AM then watch TV for a while. Has gained about 40 pounds in recent years. No ENT surgery. Has had C-spine disc surgery. No alcohol. May have a caffeine soft drinks with dinner but says caffeine doesn't affect him-discussed. Epworth 18/24  Prior to Admission medications   Medication Sig Start Date End Date Taking? Authorizing Provider  aspirin 81 MG tablet Take 81 mg by mouth daily.   Yes Historical Provider, MD  telmisartan (MICARDIS) 40 MG tablet Take 1 tablet (40 mg total) by mouth daily. 04/06/16  Yes Janith Lima, MD  zaleplon (SONATA) 10 MG capsule Take 1 capsule (10 mg total) by mouth at bedtime as needed for sleep. 05/23/16   Deneise Lever, MD   Past Medical History:  Diagnosis Date  . Anxiety 03/18/2016  . Diverticulosis   . Hypertension   . Kidney calculi   . Mitral valve prolapse    Past Surgical History:  Procedure Laterality Date  . KIDNEY STONE SURGERY  02-2014  . KNEE ARTHROSCOPY  1998   left  . NECK SURGERY  01-2013   herniated disc   Family History  Problem Relation Age of Onset  . Cancer Father   . Hypertension Father   . Hyperlipidemia Father   . Heart disease Father   . Cancer Mother   . Hypertension Mother   . Hyperlipidemia Mother   . Colon cancer Maternal  Grandfather   . Colon cancer Paternal Grandfather   . Stomach cancer Neg Hx    Social History   Social History  . Marital status: Married    Spouse name: N/A  . Number of children: N/A  . Years of education: N/A   Occupational History  . Not on file.   Social History Main Topics  . Smoking status: Never Smoker  . Smokeless tobacco: Never Used  . Alcohol use No  . Drug use: No  . Sexual activity: Yes   Other Topics Concern  . Not on file   Social History Narrative  . No narrative on file   ROS-see HPI    "+" = pos Constitutional:    weight loss, night sweats, fevers, chills, +fatigue, lassitude. HEENT:    headaches, difficulty swallowing, tooth/dental problems, sore throat,       sneezing, itching, ear ache, nasal congestion, post nasal drip, snoring CV:    chest pain, orthopnea, PND, swelling in lower extremities, anasarca,                                                      dizziness, palpitations Resp:   shortness of breath with  exertion or at rest.                productive cough,   non-productive cough, coughing up of blood.              change in color of mucus.  wheezing.   Skin:    rash or lesions. GI:  No-   heartburn, indigestion, abdominal pain, nausea, vomiting, diarrhea,                 change in bowel habits, loss of appetite GU: dysuria, change in color of urine, no urgency or frequency.   flank pain. MS:   joint pain, stiffness, decreased range of motion, back pain. Neuro-     nothing unusual Psych:  change in mood or affect.  depression or anxiety.   memory loss.  OBJ- Physical Exam General- Alert, Oriented, Affect-appropriate, Distress- none acute, tall healthy-looking gentleman Skin- rash-none, lesions- none, excoriation- none Lymphadenopathy- none Head- atraumatic            Eyes- Gross vision intact, PERRLA, conjunctivae and secretions clear            Ears- Hearing, canals-normal            Nose- + mild turbinate edema, no-Septal dev, mucus,  polyps, erosion, perforation             Throat- Mallampati III-IV , mucosa clear , drainage- none, tonsils- atrophic Neck- flexible , trachea midline, no stridor , thyroid nl, carotid no bruit Chest - symmetrical excursion , unlabored           Heart/CV- RRR , no murmur , no gallop  , no rub, nl s1 s2                           - JVD- none , edema- none, stasis changes- none, varices- none           Lung- clear to P&A, wheeze- none, cough- none , dullness-none, rub- none           Chest wall-  Abd-  Br/ Gen/ Rectal- Not done, not indicated Extrem- cyanosis- none, clubbing, none, atrophy- none, strength- nl Neuro- grossly intact to observation

## 2016-05-23 NOTE — Patient Instructions (Signed)
Order- schedule unattended home sleep test    Dx OSA  Please call as needed   Script printed to try Sonata/ zaleplon if needed for sleep

## 2016-05-24 DIAGNOSIS — G4733 Obstructive sleep apnea (adult) (pediatric): Secondary | ICD-10-CM | POA: Insufficient documentation

## 2016-05-24 DIAGNOSIS — G4700 Insomnia, unspecified: Secondary | ICD-10-CM | POA: Insufficient documentation

## 2016-05-24 NOTE — Assessment & Plan Note (Signed)
He describes chronic difficulty especially with maintaining sleep but also sometimes with initiating sleep at night. We reviewed good sleep hygiene. Discussed caution with use of caffeine. Sometimes this becomes a habit of watching for stimuli such as apneas that may wake him. Plan- he is going to try short-half-life Sonata while we await the results of sleep study. Appropriate education done.

## 2016-05-24 NOTE — Assessment & Plan Note (Signed)
Tentative diagnosis based on history and exam. I'm not sure quite what to make of the significant improvement he and his wife report since they moved to a new home. It might be current bed is more comfortable, nasal congestion from allergy and old bedroom/old mattress was a factor, or etc. Plan-sleep study

## 2016-05-31 ENCOUNTER — Emergency Department (HOSPITAL_COMMUNITY): Payer: 59

## 2016-05-31 ENCOUNTER — Encounter (HOSPITAL_COMMUNITY): Payer: Self-pay

## 2016-05-31 ENCOUNTER — Emergency Department (HOSPITAL_COMMUNITY)
Admission: EM | Admit: 2016-05-31 | Discharge: 2016-05-31 | Disposition: A | Discharge status: Home / Self Care | Payer: 59 | Attending: Emergency Medicine | Admitting: Emergency Medicine

## 2016-05-31 DIAGNOSIS — N281 Cyst of kidney, acquired: Secondary | ICD-10-CM | POA: Diagnosis not present

## 2016-05-31 DIAGNOSIS — N200 Calculus of kidney: Secondary | ICD-10-CM | POA: Insufficient documentation

## 2016-05-31 DIAGNOSIS — Z79899 Other long term (current) drug therapy: Secondary | ICD-10-CM | POA: Insufficient documentation

## 2016-05-31 DIAGNOSIS — I10 Essential (primary) hypertension: Secondary | ICD-10-CM | POA: Diagnosis not present

## 2016-05-31 DIAGNOSIS — Z7982 Long term (current) use of aspirin: Secondary | ICD-10-CM | POA: Insufficient documentation

## 2016-05-31 DIAGNOSIS — R109 Unspecified abdominal pain: Secondary | ICD-10-CM | POA: Diagnosis present

## 2016-05-31 LAB — BASIC METABOLIC PANEL
Anion gap: 9 (ref 5–15)
BUN: 19 mg/dL (ref 6–20)
CHLORIDE: 105 mmol/L (ref 101–111)
CO2: 25 mmol/L (ref 22–32)
Calcium: 8.6 mg/dL — ABNORMAL LOW (ref 8.9–10.3)
Creatinine, Ser: 1.35 mg/dL — ABNORMAL HIGH (ref 0.61–1.24)
GFR calc Af Amer: >60 mL/min (ref >60)
GFR calc non Af Amer: 58 mL/min — ABNORMAL LOW (ref >60)
Glucose, Bld: 122 mg/dL — ABNORMAL HIGH (ref 65–99)
POTASSIUM: 4.3 mmol/L (ref 3.5–5.1)
SODIUM: 139 mmol/L (ref 135–145)

## 2016-05-31 LAB — URINALYSIS, ROUTINE W REFLEX MICROSCOPIC
Bilirubin Urine: NEGATIVE
Glucose, UA: NEGATIVE mg/dL
KETONES UR: NEGATIVE mg/dL
Leukocytes, UA: NEGATIVE
Nitrite: NEGATIVE
PH: 5 (ref 5.0–8.0)
PROTEIN: NEGATIVE mg/dL
SQUAMOUS EPITHELIAL / LPF: NONE SEEN
Specific Gravity, Urine: 1.024 (ref 1.005–1.030)

## 2016-05-31 LAB — CBC
HEMATOCRIT: 44 % (ref 39.0–52.0)
HEMOGLOBIN: 14.7 g/dL (ref 13.0–17.0)
MCH: 28.7 pg (ref 26.0–34.0)
MCHC: 33.4 g/dL (ref 30.0–36.0)
MCV: 85.9 fL (ref 78.0–100.0)
Platelets: 230 10*3/uL (ref 150–400)
RBC: 5.12 MIL/uL (ref 4.22–5.81)
RDW: 13.6 % (ref 11.5–15.5)
WBC: 8.3 10*3/uL (ref 4.0–10.5)

## 2016-05-31 MED ORDER — ONDANSETRON HCL 4 MG/2ML IJ SOLN
INTRAMUSCULAR | Status: AC
  Filled 2016-05-31: qty 2

## 2016-05-31 MED ORDER — FENTANYL CITRATE (PF) 100 MCG/2ML IJ SOLN
INTRAMUSCULAR | Status: AC
  Administered 2016-05-31: 50 ug via INTRAVENOUS
  Filled 2016-05-31: qty 2

## 2016-05-31 MED ORDER — SODIUM CHLORIDE 0.9 % IV BOLUS (SEPSIS)
1000.0000 mL | Freq: Once | INTRAVENOUS | Status: AC
  Administered 2016-05-31: 1000 mL via INTRAVENOUS

## 2016-05-31 MED ORDER — TAMSULOSIN HCL 0.4 MG PO CAPS: 0.4000 mg | ORAL_CAPSULE | Freq: Every day | ORAL | 0 refills | Status: DC

## 2016-05-31 MED ORDER — MORPHINE SULFATE (PF) 4 MG/ML IV SOLN
4.0000 mg | Freq: Once | INTRAVENOUS | Status: AC
  Administered 2016-05-31: 4 mg via INTRAVENOUS
  Filled 2016-05-31: qty 1

## 2016-05-31 MED ORDER — FENTANYL CITRATE (PF) 100 MCG/2ML IJ SOLN
50.0000 ug | INTRAMUSCULAR | Status: AC | PRN
  Administered 2016-05-31 (×2): 50 ug via INTRAVENOUS
  Filled 2016-05-31: qty 2

## 2016-05-31 MED ORDER — OXYCODONE-ACETAMINOPHEN 5-325 MG PO TABS
1.0000 | ORAL_TABLET | Freq: Once | ORAL | Status: AC
Start: 2016-05-31 — End: 2016-05-31
  Administered 2016-05-31: 1 via ORAL
  Filled 2016-05-31: qty 1

## 2016-05-31 MED ORDER — ONDANSETRON 4 MG PO TBDP: 4.0000 mg | ORAL_TABLET | Freq: Three times a day (TID) | ORAL | 0 refills | Status: DC | PRN

## 2016-05-31 MED ORDER — ONDANSETRON HCL 4 MG/2ML IJ SOLN
4.0000 mg | Freq: Once | INTRAMUSCULAR | Status: AC
  Administered 2016-05-31: 4 mg via INTRAVENOUS

## 2016-05-31 MED ORDER — ONDANSETRON HCL 4 MG/2ML IJ SOLN
4.0000 mg | Freq: Once | INTRAMUSCULAR | Status: AC
  Administered 2016-05-31: 4 mg via INTRAVENOUS
  Filled 2016-05-31: qty 2

## 2016-05-31 MED ORDER — OXYCODONE-ACETAMINOPHEN 5-325 MG PO TABS: 1.0000 | ORAL_TABLET | Freq: Four times a day (QID) | ORAL | 0 refills | Status: DC | PRN

## 2016-05-31 MED ORDER — KETOROLAC TROMETHAMINE 15 MG/ML IJ SOLN
15.0000 mg | Freq: Once | INTRAMUSCULAR | Status: AC
  Administered 2016-05-31: 15 mg via INTRAVENOUS
  Filled 2016-05-31: qty 1

## 2016-05-31 NOTE — ED Triage Notes (Signed)
Pt endorse right flank pain this evening around 2000, pt has hx of 9 kidney stones and surgery to remove kidney stones. Pt nauseous and vomiting in triage. Pt hypertensive in triage. Denies hematuria.

## 2016-05-31 NOTE — ED Provider Notes (Signed)
[place] [facility] DEPT Provider Note   CSN: [id number] Arrival date & time: 05/31/16  6761  By signing my name below, I, Oleh Genin, attest that this documentation has been prepared under the direction and in the presence of Maanvi Lecompte, Barbette Hair, MD. Electronically Signed: Oleh Genin, Scribe. 05/31/16. 6:50 AM.   History   Chief Complaint Chief Complaint  Patient presents with  . Flank Pain    HPI Matthew Grant. is a 56 y.o. male with history of HTN and prior nephrolithiasis requiring lithotripsy who presents to the ED for evaluation of flank pain. This patient states that 3 days ago he developed R flank pain which, in the last 5 hours has been severe. At interview, his pain is 9/10. No dysuria or hematuria. He has history of urinary stones requiring intervention. He states his symptoms today are typical for him in presentation. No fever, nausea, or vomiting.  The history is provided by the patient. No language interpreter was used.  Flank Pain  This is a new problem. The current episode started more than 2 days ago. The problem occurs constantly. The problem has been gradually worsening.    Past Medical History:  Diagnosis Date  . Anxiety 03/18/2016  . Diverticulosis   . Hypertension   . Kidney calculi   . Mitral valve prolapse     Patient Active Problem List   Diagnosis Date Noted  . Obstructive sleep apnea 05/24/2016  . Insomnia 05/24/2016  . Bilateral hearing loss due to cerumen impaction 05/16/2016  . GERD with esophagitis 03/28/2016  . Hypersomnolence 03/18/2016  . Anxiety 03/18/2016  . Abnormal stress electrocardiogram test using treadmill 07/15/2015  . DOE (dyspnea on exertion) 05/17/2015  . Essential hypertension, benign 05/22/2012  . Nonspecific abnormal electrocardiogram (ECG) (EKG) 05/22/2012  . Routine general medical examination at a health care facility 10/31/2011    Past Surgical History:  Procedure Laterality Date  . KIDNEY STONE SURGERY   02-2014  . KNEE ARTHROSCOPY  1998   left  . NECK SURGERY  01-2013   herniated disc       Home Medications    Prior to Admission medications   Medication Sig Start Date End Date Taking? Authorizing Provider  aspirin 81 MG tablet Take 81 mg by mouth daily.   Yes [provider]  telmisartan (MICARDIS) 40 MG tablet Take 1 tablet (40 mg total) by mouth daily. 04/06/16  Yes Janith Lima, MD  ondansetron (ZOFRAN ODT) 4 MG disintegrating tablet Take 1 tablet (4 mg total) by mouth every 8 (eight) hours as needed for nausea or vomiting. 05/31/16   Abhimanyu Cruces, Barbette Hair, MD  oxyCODONE-acetaminophen (PERCOCET/ROXICET) 5-325 MG tablet Take 1-2 tablets by mouth every 6 (six) hours as needed for severe pain. 05/31/16   Teruko Joswick, Barbette Hair, MD  tamsulosin (FLOMAX) 0.4 MG CAPS capsule Take 1 capsule (0.4 mg total) by mouth daily. 05/31/16   Eilan Mcinerny, Barbette Hair, MD  zaleplon (SONATA) 10 MG capsule Take 1 capsule (10 mg total) by mouth at bedtime as needed for sleep. Patient not taking: Reported on 05/31/2016 05/23/16   Deneise Lever, MD    Family History Family History  Problem Relation Age of Onset  . Cancer Father   . Hypertension Father   . Hyperlipidemia Father   . Heart disease Father   . Cancer Mother   . Hypertension Mother   . Hyperlipidemia Mother   . Colon cancer Maternal Grandfather   . Colon cancer Paternal Grandfather   . Stomach  cancer Neg Hx     Social History Social History  Substance Use Topics  . Smoking status: Never Smoker  . Smokeless tobacco: Never Used  . Alcohol use No     Allergies   Iohexol and Ivp dye [iodinated diagnostic agents]   Review of Systems Review of Systems  Constitutional: Negative for fever.  Gastrointestinal: Negative for nausea and vomiting.  Genitourinary: Positive for flank pain. Negative for dysuria and hematuria.  All other systems reviewed and are negative.    Physical Exam Updated Vital Signs BP 110/71   Pulse (!) 52   Temp  97.8 F (36.6 C) (Oral)   Resp (!) 22   Ht 6\' 4"  (1.93 m)   Wt 260 lb (117.9 kg)   SpO2 96%   BMI 31.65 kg/m   Physical Exam  Constitutional: He is oriented to person, place, and time.  Uncomfortable appearing but nontoxic  HENT:  Head: Normocephalic and atraumatic.  Cardiovascular: Normal rate, regular rhythm and normal heart sounds.   No murmur heard. Pulmonary/Chest: Effort normal and breath sounds normal. No respiratory distress. He has no wheezes.  Abdominal: Soft. Bowel sounds are normal. There is no tenderness. There is no rebound.  Genitourinary:  Genitourinary Comments: No CVA tenderness  Musculoskeletal: He exhibits no edema.  Neurological: He is alert and oriented to person, place, and time.  Skin: Skin is warm and dry.  Psychiatric: He has a normal mood and affect.  Nursing note and vitals reviewed.    ED Treatments / Results  Labs (all labs ordered are listed, but only abnormal results are displayed) Labs Reviewed  URINALYSIS, ROUTINE W REFLEX MICROSCOPIC - Abnormal; Notable for the following:       Result Value   APPearance HAZY (*)    Hgb urine dipstick SMALL (*)    Bacteria, UA RARE (*)    All other components within normal limits  BASIC METABOLIC PANEL - Abnormal; Notable for the following:    Glucose, Bld 122 (*)    Creatinine, Ser 1.35 (*)    Calcium 8.6 (*)    GFR calc non Af Amer 58 (*)    All other components within normal limits  CBC    EKG  EKG Interpretation None       Radiology Dg Abdomen 1 View  Result Date: 05/31/2016 CLINICAL DATA:  Acute onset of right flank pain.  Initial encounter. EXAM: ABDOMEN - 1 VIEW COMPARISON:  CT of the abdomen and pelvis performed 03/23/2009 FINDINGS: The visualized bowel gas pattern is unremarkable. Scattered air and stool filled loops of colon are seen; no abnormal dilatation of small bowel loops is seen to suggest small bowel obstruction. No free intra-abdominal air is identified, though evaluation for  free air is limited on a single supine view. There is question of a 4 mm stone overlying the distal right ureter, though this may be artifactual in nature. The visualized osseous structures are within normal limits; the sacroiliac joints are unremarkable in appearance. The visualized lung bases are essentially clear. IMPRESSION: 1. Unremarkable bowel gas pattern; no free intra-abdominal air seen. 2. Question of 4 mm stone overlying the distal right ureter, though this may be artifactual in nature. Electronically Signed   By: Garald Balding M.D.   On: 05/31/2016 02:48   US Renal  Result Date: 05/31/2016 CLINICAL DATA:  Right flank pain for 4 days EXAM: RENAL / URINARY TRACT ULTRASOUND COMPLETE COMPARISON:  CT abdomen and pelvis 03/23/2009 FINDINGS: Right Kidney: Length: 13.1 cm.  Echogenicity within normal limits. No mass or hydronephrosis visualized. Left Kidney: Length: 13.9 cm. Small simple appearing cyst in the lower pole left kidney measuring 2.1 cm maximal diameter. Normal parenchymal echotexture. No hydronephrosis. Bladder: No bladder wall thickening or filling defect. Bilateral urine flow jets are demonstrated on color flow Doppler imaging. Incidental note of mild prostate enlargement at 3.9 x 3 x 3.7 cm. IMPRESSION: Small cyst on the left kidney.  No hydronephrosis in either kidney. Electronically Signed   By: Lucienne Capers M.D.   On: 05/31/2016 03:07    Procedures Procedures (including critical care time)  Medications Ordered in ED Medications  fentaNYL (SUBLIMAZE) injection 50 mcg (50 mcg Intravenous Given 05/31/16 0341)  ondansetron (ZOFRAN) injection 4 mg (4 mg Intravenous Given 05/31/16 0112)  sodium chloride 0.9 % bolus 1,000 mL (0 mLs Intravenous Stopped 05/31/16 0341)  morphine 4 MG/ML injection 4 mg (4 mg Intravenous Given 05/31/16 0216)  ondansetron (ZOFRAN) injection 4 mg (4 mg Intravenous Given 05/31/16 0216)  ketorolac (TORADOL) 15 MG/ML injection 15 mg (15 mg Intravenous Given 05/31/16  0215)  morphine 4 MG/ML injection 4 mg (4 mg Intravenous Given 05/31/16 0555)  oxyCODONE-acetaminophen (PERCOCET/ROXICET) 5-325 MG per tablet 1 tablet (1 tablet Oral Given 05/31/16 0555)     Initial Impression / Assessment and Plan / ED Course  I have reviewed the triage vital signs and the nursing notes.  Pertinent labs & imaging results that were available during my care of the patient were reviewed by me and considered in my medical decision making (see chart for details).     Patient presents with right-sided flank pain consistent with prior kidney stones. He is uncomfortable appearing but nontoxic. Vital signs reassuring. Patient was given pain and nausea medication as well as fluids. Renal ultrasound and KUB obtained. KUB concerning for a right 4 mm distal ureteral stone. Ultrasound without evidence of hydronephrosis. Patient much improved with pain and nausea medication. He last saw urology [facility] but is requesting neurology referral. This was provided. He was given return precautions and expected management at home.  After history, exam, and medical workup I feel the patient has been appropriately medically screened and is safe for discharge home. Pertinent diagnoses were discussed with the patient. Patient was given return precautions.   Final Clinical Impressions(s) / ED Diagnoses   Final diagnoses:  Kidney stone    New Prescriptions New Prescriptions   ONDANSETRON (ZOFRAN ODT) 4 MG DISINTEGRATING TABLET    Take 1 tablet (4 mg total) by mouth every 8 (eight) hours as needed for nausea or vomiting.   OXYCODONE-ACETAMINOPHEN (PERCOCET/ROXICET) 5-325 MG TABLET    Take 1-2 tablets by mouth every 6 (six) hours as needed for severe pain.   TAMSULOSIN (FLOMAX) 0.4 MG CAPS CAPSULE    Take 1 capsule (0.4 mg total) by mouth daily.     Merryl Hacker, MD 05/31/16 2259

## 2016-06-03 ENCOUNTER — Ambulatory Visit (HOSPITAL_COMMUNITY)
Admission: EM | Admit: 2016-06-03 | Discharge: 2016-06-03 | Disposition: A | Discharge status: Home / Self Care | Payer: 59 | Attending: Internal Medicine | Admitting: Internal Medicine

## 2016-06-03 ENCOUNTER — Encounter (HOSPITAL_COMMUNITY): Payer: Self-pay | Admitting: *Deleted

## 2016-06-03 DIAGNOSIS — M25421 Effusion, right elbow: Secondary | ICD-10-CM | POA: Diagnosis not present

## 2016-06-03 DIAGNOSIS — Z7982 Long term (current) use of aspirin: Secondary | ICD-10-CM | POA: Insufficient documentation

## 2016-06-03 DIAGNOSIS — F419 Anxiety disorder, unspecified: Secondary | ICD-10-CM | POA: Diagnosis not present

## 2016-06-03 DIAGNOSIS — I10 Essential (primary) hypertension: Secondary | ICD-10-CM | POA: Insufficient documentation

## 2016-06-03 DIAGNOSIS — Z79899 Other long term (current) drug therapy: Secondary | ICD-10-CM | POA: Diagnosis not present

## 2016-06-03 MED ORDER — CEPHALEXIN 500 MG PO CAPS: 500.0000 mg | ORAL_CAPSULE | Freq: Four times a day (QID) | ORAL | 0 refills | Status: DC

## 2016-06-03 NOTE — Discharge Instructions (Signed)
Keep the dressing on for the next with 3 days. You may remove it periodically to move the elbow around and bathing. Make sure not to get it too tight where he feel numbness or coldness in your forearm or hand. It may return. This of the nature of this type of inflammation. Take the antibiotic as directed. A culture has been started and for any information that will change the treatment we will call you.

## 2016-06-03 NOTE — ED Triage Notes (Signed)
Pt   Has   Pain  And   Swelling  Of  r  Elbow   And  Is   Warm  To  touchn     Seen   Here   2   Weeks  Ago  And  Told  To  Return if   Worse

## 2016-06-03 NOTE — ED Provider Notes (Signed)
CSN: [id number]     Arrival date & time 06/03/16  1644 History   None    Chief Complaint  Patient presents with  . Joint Swelling   (Consider location/radiation/quality/duration/timing/severity/associated sxs/prior Treatment) 56 year old male presents to the urgent care with effusion of the right olecranon bursa. He was in the urgent care approximately 3 weeks ago where he had it aspirated and injected with a corticosteroid. The patient states it felt better and was "normal" for about a week and a half or 2 weeks and then started to swell up again. He presents with a moderate effusion but this time it is much more tender than previously and there is increased warmth and tenderness. He states he has had a temperature at home of 99.9 and just does not feel as well as usual.      Past Medical History:  Diagnosis Date  . Anxiety 03/18/2016  . Diverticulosis   . Hypertension   . Kidney calculi   . Mitral valve prolapse    Past Surgical History:  Procedure Laterality Date  . KIDNEY STONE SURGERY  02-2014  . KNEE ARTHROSCOPY  1998   left  . NECK SURGERY  01-2013   herniated disc   Family History  Problem Relation Age of Onset  . Cancer Father   . Hypertension Father   . Hyperlipidemia Father   . Heart disease Father   . Cancer Mother   . Hypertension Mother   . Hyperlipidemia Mother   . Colon cancer Maternal Grandfather   . Colon cancer Paternal Grandfather   . Stomach cancer Neg Hx    Social History  Substance Use Topics  . Smoking status: Never Smoker  . Smokeless tobacco: Never Used  . Alcohol use No    Review of Systems  Constitutional: Positive for fever.  Respiratory: Negative.   Musculoskeletal:       As per history of present illness  Neurological: Negative.   All other systems reviewed and are negative.   Allergies  Iohexol and Ivp dye [iodinated diagnostic agents]  Home Medications   Prior to Admission medications   Medication Sig Start Date End Date  Taking? Authorizing Provider  aspirin 81 MG tablet Take 81 mg by mouth daily.    [provider]  cephALEXin (KEFLEX) 500 MG capsule Take 1 capsule (500 mg total) by mouth 4 (four) times daily. 06/03/16   Janne Napoleon, NP  ondansetron (ZOFRAN ODT) 4 MG disintegrating tablet Take 1 tablet (4 mg total) by mouth every 8 (eight) hours as needed for nausea or vomiting. 05/31/16   Horton, Barbette Hair, MD  oxyCODONE-acetaminophen (PERCOCET/ROXICET) 5-325 MG tablet Take 1-2 tablets by mouth every 6 (six) hours as needed for severe pain. 05/31/16   Horton, Barbette Hair, MD  tamsulosin (FLOMAX) 0.4 MG CAPS capsule Take 1 capsule (0.4 mg total) by mouth daily. 05/31/16   Horton, Barbette Hair, MD  telmisartan (MICARDIS) 40 MG tablet Take 1 tablet (40 mg total) by mouth daily. 04/06/16   Janith Lima, MD   Meds Ordered and Administered this Visit  Medications - No data to display  BP 119/90 (BP Location: Right Arm)   Pulse 80   Temp 99.2 F (37.3 C)   Resp 18   SpO2 100%  No data found.   Physical Exam  Constitutional: He is oriented to person, place, and time. He appears well-developed and well-nourished.  Neck: Neck supple.  Pulmonary/Chest: Effort normal.  Musculoskeletal: Normal range of motion.  Right elbow  olecranon effusion. Able to flex and extend the elbow normally. Positive for localized tenderness over the bursa. Minor erythema and much tenderness. No lymphangitis. Distal neurovascular motor sensory is grossly intact. No Tenderness to the lateral or medial epicondyles.   Neurological: He is alert and oriented to person, place, and time.  Skin: Skin is warm and dry.    Urgent Care Course     Procedures (including critical care time)  Labs Review Labs Reviewed - No data to display  Imaging Review No results found.   Visual Acuity Review  Right Eye Distance:   Left Eye Distance:   Bilateral Distance:    Right Eye Near:   Left Eye Near:    Bilateral Near:         MDM    1. Effusion of olecranon bursa, right    eep the dressing on for the next with 3 days. You may remove it periodically to move the elbow around and bathing. Make sure not to get it too tight where he feel numbness or coldness in your forearm or hand. It may return. This of the nature of this type of inflammation. Take the antibiotic as directed. A culture has been started and for any information that will change the treatment we will call you. Meds ordered this encounter  Medications  . cephALEXin (KEFLEX) 500 MG capsule    Sig: Take 1 capsule (500 mg total) by mouth 4 (four) times daily.    Dispense:  28 capsule    Refill:  0    Order Specific Question:   Supervising Provider    Answer:   Sherlene Shams [[id number]]      Janne Napoleon, NP 06/03/16 1849

## 2016-06-06 LAB — AEROBIC CULTURE  (SUPERFICIAL SPECIMEN)

## 2016-06-06 LAB — AEROBIC CULTURE W GRAM STAIN (SUPERFICIAL SPECIMEN): Culture: NO GROWTH

## 2016-06-09 DIAGNOSIS — M25521 Pain in right elbow: Secondary | ICD-10-CM | POA: Diagnosis not present

## 2016-06-16 DIAGNOSIS — M25721 Osteophyte, right elbow: Secondary | ICD-10-CM | POA: Diagnosis not present

## 2016-06-16 DIAGNOSIS — M7021 Olecranon bursitis, right elbow: Secondary | ICD-10-CM | POA: Diagnosis not present

## 2016-06-16 DIAGNOSIS — Z0189 Encounter for other specified special examinations: Secondary | ICD-10-CM | POA: Diagnosis not present

## 2016-06-27 DIAGNOSIS — H903 Sensorineural hearing loss, bilateral: Secondary | ICD-10-CM | POA: Diagnosis not present

## 2016-06-27 DIAGNOSIS — H6122 Impacted cerumen, left ear: Secondary | ICD-10-CM | POA: Diagnosis not present

## 2016-08-11 ENCOUNTER — Ambulatory Visit: Payer: 59 | Admitting: Internal Medicine

## 2016-09-07 ENCOUNTER — Encounter: Payer: Self-pay | Admitting: Internal Medicine

## 2016-09-07 ENCOUNTER — Ambulatory Visit (INDEPENDENT_AMBULATORY_CARE_PROVIDER_SITE_OTHER): Payer: 59 | Admitting: Internal Medicine

## 2016-09-07 VITALS — BP 122/84 | HR 94 | Ht 76.0 in | Wt 263.0 lb

## 2016-09-07 DIAGNOSIS — J309 Allergic rhinitis, unspecified: Secondary | ICD-10-CM

## 2016-09-07 DIAGNOSIS — I10 Essential (primary) hypertension: Secondary | ICD-10-CM

## 2016-09-07 DIAGNOSIS — F419 Anxiety disorder, unspecified: Secondary | ICD-10-CM

## 2016-09-07 MED ORDER — TRIAMCINOLONE ACETONIDE 55 MCG/ACT NA AERO: 2.0000 | INHALATION_SPRAY | Freq: Every day | NASAL | 12 refills | Status: DC

## 2016-09-07 MED ORDER — PREDNISONE 10 MG PO TABS: ORAL_TABLET | ORAL | 0 refills | Status: DC

## 2016-09-07 MED ORDER — CETIRIZINE HCL 10 MG PO TABS: 10.0000 mg | ORAL_TABLET | Freq: Every day | ORAL | 11 refills | Status: DC

## 2016-09-07 MED ORDER — METHYLPREDNISOLONE ACETATE 80 MG/ML IJ SUSP
80.0000 mg | Freq: Once | INTRAMUSCULAR | Status: AC
  Administered 2016-09-07: 80 mg via INTRAMUSCULAR

## 2016-09-07 NOTE — Progress Notes (Signed)
   Subjective:    Patient ID: Matthew Grant., male    DOB: 03-20-1960, 56 y.o.   MRN: [id number]  HPI  Here to f/u with c/o cough; Does have several wks ongoing nasal allergy symptoms with clearish congestion, itch and sneezing, and scant prod cough without fever, pain, ST, swelling or wheezing. Pt denies chest pain, increased sob or doe, orthopnea, PND, increased LE swelling, palpitations, dizziness or syncope.  Pt denies new neurological symptoms such as new headache, or facial or extremity weakness or numbness   Pt denies polydipsia, polyuria.  Denies worsening depressive symptoms, suicidal ideation, or panic Past Medical History:  Diagnosis Date  . Anxiety 03/18/2016  . Diverticulosis   . Hypertension   . Kidney calculi   . Mitral valve prolapse    Past Surgical History:  Procedure Laterality Date  . KIDNEY STONE SURGERY  02-2014  . KNEE ARTHROSCOPY  1998   left  . NECK SURGERY  01-2013   herniated disc    reports that he has never smoked. He has never used smokeless tobacco. He reports that he does not drink alcohol or use drugs. family history includes Cancer in his father and mother; Colon cancer in his maternal grandfather and paternal grandfather; Heart disease in his father; Hyperlipidemia in his father and mother; Hypertension in his father and mother. Allergies  Allergen Reactions  . Iohexol Nausea And Vomiting     Desc: SEVERE NAUSEA AND VOMITING WITH IV CONTRAST   . Ivp Dye [Iodinated Diagnostic Agents] Nausea And Vomiting    SEVERE N/V   Current Outpatient Prescriptions on File Prior to Visit  Medication Sig Dispense Refill  . telmisartan (MICARDIS) 40 MG tablet Take 1 tablet (40 mg total) by mouth daily. 90 tablet 1   No current facility-administered medications on file prior to visit.    Review of Systems  Constitutional: Negative for other unusual diaphoresis or sweats HENT: Negative for ear discharge or swelling Eyes: Negative for other worsening visual  disturbances Respiratory: Negative for stridor or other swelling  Gastrointestinal: Negative for worsening distension or other blood Genitourinary: Negative for retention or other urinary change Musculoskeletal: Negative for other MSK pain or swelling Skin: Negative for color change or other new lesions Neurological: Negative for worsening tremors and other numbness  Psychiatric/Behavioral: Negative for worsening agitation or other fatigue All other system neg per pt    Objective:   Physical Exam BP 122/84   Pulse 94   Ht 6\' 4"  (1.93 m)   Wt 263 lb (119.3 kg)   SpO2 99%   BMI 32.01 kg/m  VS noted, not ill appaering Constitutional: Pt appears in NAD HENT: Head: NCAT.  Right Ear: External ear normal.  Left Ear: External ear normal.  Eyes: . Pupils are equal, round, and reactive to light. Conjunctivae and EOM are normal Nose: without d/c or deformity Neck: Neck supple. Gross normal ROM Cardiovascular: Normal rate and regular rhythm.   Pulmonary/Chest: Effort normal and breath sounds decreased  without rales or wheezing.  Neurological: Pt is alert. At baseline orientation, motor grossly intact Skin: Skin is warm. No rashes, other new lesions, no LE edema Psychiatric: Pt behavior is normal without agitation , 1-2+ nervous No other exam findings    Assessment & Plan:

## 2016-09-07 NOTE — Patient Instructions (Signed)
You had the steroid shot today  Please take all new medication as prescribed - the prednisone, and zyrtec and nasacort  Please continue all other medications as before, and refills have been done if requested.  Please have the pharmacy call with any other refills you may need.  Please keep your appointments with your specialists as you may have planned

## 2016-09-10 DIAGNOSIS — R059 Cough, unspecified: Secondary | ICD-10-CM | POA: Insufficient documentation

## 2016-09-10 DIAGNOSIS — R05 Cough: Secondary | ICD-10-CM | POA: Insufficient documentation

## 2016-09-10 DIAGNOSIS — J309 Allergic rhinitis, unspecified: Secondary | ICD-10-CM | POA: Insufficient documentation

## 2016-09-10 NOTE — Assessment & Plan Note (Signed)
Mild to mod, for depomedrol IM 80, predpac asd, zyrtec and nasacort for longer term tx,  to f/u any worsening symptoms or concerns

## 2016-09-10 NOTE — Assessment & Plan Note (Signed)
Reassured, does not appear to have infection or PNA by this exam

## 2016-09-10 NOTE — Assessment & Plan Note (Signed)
stable overall by history and exam, recent data reviewed with pt, and pt to continue medical treatment as before,  to f/u any worsening symptoms or concerns BP Readings from Last 3 Encounters:  09/07/16 122/84  06/03/16 119/90  05/31/16 121/90

## 2016-09-29 ENCOUNTER — Other Ambulatory Visit: Payer: Self-pay | Admitting: Internal Medicine

## 2016-09-29 DIAGNOSIS — I10 Essential (primary) hypertension: Secondary | ICD-10-CM

## 2017-01-10 ENCOUNTER — Other Ambulatory Visit: Payer: Self-pay | Admitting: Internal Medicine

## 2017-01-10 DIAGNOSIS — I10 Essential (primary) hypertension: Secondary | ICD-10-CM

## 2017-01-30 DIAGNOSIS — Z23 Encounter for immunization: Secondary | ICD-10-CM | POA: Diagnosis not present

## 2017-04-20 ENCOUNTER — Other Ambulatory Visit: Payer: Self-pay | Admitting: Internal Medicine

## 2017-04-20 DIAGNOSIS — I10 Essential (primary) hypertension: Secondary | ICD-10-CM

## 2017-05-22 ENCOUNTER — Encounter: Payer: Self-pay | Admitting: Internal Medicine

## 2017-05-22 ENCOUNTER — Other Ambulatory Visit (INDEPENDENT_AMBULATORY_CARE_PROVIDER_SITE_OTHER): Payer: 59

## 2017-05-22 ENCOUNTER — Ambulatory Visit (INDEPENDENT_AMBULATORY_CARE_PROVIDER_SITE_OTHER): Payer: 59 | Admitting: Internal Medicine

## 2017-05-22 VITALS — BP 138/82 | HR 61 | Temp 98.2°F | Resp 16 | Ht 76.0 in | Wt 268.5 lb

## 2017-05-22 DIAGNOSIS — K2100 Gastro-esophageal reflux disease with esophagitis, without bleeding: Secondary | ICD-10-CM

## 2017-05-22 DIAGNOSIS — Z0000 Encounter for general adult medical examination without abnormal findings: Secondary | ICD-10-CM

## 2017-05-22 DIAGNOSIS — R739 Hyperglycemia, unspecified: Secondary | ICD-10-CM | POA: Insufficient documentation

## 2017-05-22 DIAGNOSIS — Z23 Encounter for immunization: Secondary | ICD-10-CM | POA: Diagnosis not present

## 2017-05-22 DIAGNOSIS — I10 Essential (primary) hypertension: Secondary | ICD-10-CM

## 2017-05-22 LAB — LIPID PANEL
Cholesterol: 173 mg/dL (ref 0–200)
HDL: 39.1 mg/dL (ref >39.00)
LDL Cholesterol: 115 mg/dL — ABNORMAL HIGH (ref 0–99)
NonHDL: 133.86
Total CHOL/HDL Ratio: 4
Triglycerides: 96 mg/dL (ref 0.0–149.0)
VLDL: 19.2 mg/dL (ref 0.0–40.0)

## 2017-05-22 LAB — COMPREHENSIVE METABOLIC PANEL
ALT: 23 U/L (ref 0–53)
AST: 13 U/L (ref 0–37)
Albumin: 4 g/dL (ref 3.5–5.2)
Alkaline Phosphatase: 75 U/L (ref 39–117)
BUN: 13 mg/dL (ref 6–23)
CO2: 28 mEq/L (ref 19–32)
Calcium: 8.8 mg/dL (ref 8.4–10.5)
Chloride: 105 mEq/L (ref 96–112)
Creatinine, Ser: 1.02 mg/dL (ref 0.40–1.50)
GFR: 80.33 mL/min (ref >60.00)
Glucose, Bld: 96 mg/dL (ref 70–99)
Potassium: 4.2 mEq/L (ref 3.5–5.1)
Sodium: 140 mEq/L (ref 135–145)
Total Bilirubin: 0.6 mg/dL (ref 0.2–1.2)
Total Protein: 6.3 g/dL (ref 6.0–8.3)

## 2017-05-22 LAB — CBC WITH DIFFERENTIAL/PLATELET
Basophils Absolute: 0 10*3/uL (ref 0.0–0.1)
Basophils Relative: 0.8 % (ref 0.0–3.0)
Eosinophils Absolute: 0.1 10*3/uL (ref 0.0–0.7)
Eosinophils Relative: 2 % (ref 0.0–5.0)
HCT: 43.8 % (ref 39.0–52.0)
Hemoglobin: 15 g/dL (ref 13.0–17.0)
Lymphocytes Relative: 33.4 % (ref 12.0–46.0)
Lymphs Abs: 1.8 10*3/uL (ref 0.7–4.0)
MCHC: 34.1 g/dL (ref 30.0–36.0)
MCV: 84.6 fl (ref 78.0–100.0)
Monocytes Absolute: 0.3 10*3/uL (ref 0.1–1.0)
Monocytes Relative: 5.7 % (ref 3.0–12.0)
Neutro Abs: 3.1 10*3/uL (ref 1.4–7.7)
Neutrophils Relative %: 58.1 % (ref 43.0–77.0)
Platelets: 228 10*3/uL (ref 150.0–400.0)
RBC: 5.18 Mil/uL (ref 4.22–5.81)
RDW: 14.3 % (ref 11.5–15.5)
WBC: 5.3 10*3/uL (ref 4.0–10.5)

## 2017-05-22 LAB — HEMOGLOBIN A1C: Hgb A1c MFr Bld: 5.4 % (ref 4.6–6.5)

## 2017-05-22 LAB — PSA: PSA: 1.23 ng/mL (ref 0.10–4.00)

## 2017-05-22 NOTE — Patient Instructions (Signed)

## 2017-05-22 NOTE — Progress Notes (Signed)
Subjective:  Patient ID: Matthew Durie., male    DOB: 1960/03/27  Age: 57 y.o. MRN: [id number]  CC: Hypertension and Annual Exam   HPI Matthew Grant. presents for a CPX.  He feels well and offers no complaints.  His tells me his blood pressure has been well controlled.  He is able to run 2 miles on a treadmill without experiencing shortness of breath, DOE, CP, palpitations, edema, or fatigue.  Outpatient Medications Prior to Visit  Medication Sig Dispense Refill  . aspirin 81 MG EC tablet     . telmisartan (MICARDIS) 40 MG tablet Take 1 tablet (40 mg total) by mouth daily. 90 tablet 0  . cetirizine (ZYRTEC) 10 MG tablet Take 1 tablet (10 mg total) by mouth daily. 30 tablet 11  . predniSONE (DELTASONE) 10 MG tablet 3 tabs by mouth per day for 3 days,2tabs per day for 3 days,1tab per day for 3 days, then stop 18 tablet 0  . triamcinolone (NASACORT) 55 MCG/ACT AERO nasal inhaler Place 2 sprays into the nose daily. 1 Inhaler 12   No facility-administered medications prior to visit.     ROS Review of Systems  Constitutional: Negative.  Negative for diaphoresis, fatigue and unexpected weight change.  HENT: Negative.   Eyes: Negative for visual disturbance.  Respiratory: Negative for cough, chest tightness, shortness of breath and wheezing.   Cardiovascular: Negative for chest pain, palpitations and leg swelling.  Gastrointestinal: Negative for abdominal pain, constipation, diarrhea and nausea.  Endocrine: Negative.   Genitourinary: Negative.   Musculoskeletal: Negative.  Negative for arthralgias and myalgias.  Skin: Negative.   Allergic/Immunologic: Negative.   Neurological: Negative.  Negative for dizziness, weakness and light-headedness.  Hematological: Negative for adenopathy. Does not bruise/bleed easily.  Psychiatric/Behavioral: Negative.     Objective:  BP 138/82 (BP Location: Left Arm, Patient Position: Sitting, Cuff Size: Large)   Pulse 61   Temp 98.2 F  (36.8 C) (Oral)   Resp 16   Ht 6\' 4"  (1.93 m)   Wt 268 lb 8 oz (121.8 kg)   SpO2 96%   BMI 32.68 kg/m   BP Readings from Last 3 Encounters:  05/22/17 138/82  09/07/16 122/84  06/03/16 119/90    Wt Readings from Last 3 Encounters:  05/22/17 268 lb 8 oz (121.8 kg)  09/07/16 263 lb (119.3 kg)  05/31/16 260 lb (117.9 kg)    Physical Exam  Constitutional: He is oriented to person, place, and time. No distress.  HENT:  Mouth/Throat: Oropharynx is clear and moist. No oropharyngeal exudate.  Eyes: Conjunctivae are normal. No scleral icterus.  Neck: Normal range of motion. Neck supple. No JVD present. No thyromegaly present.  Cardiovascular: Regular rhythm, S1 normal, S2 normal and normal heart sounds. Bradycardia present. Exam reveals no gallop.  No murmur heard. EKG ---  Sinus  Bradycardia  WITHIN NORMAL LIMITS  Pulmonary/Chest: Effort normal and breath sounds normal. No stridor. No respiratory distress. He has no wheezes. He has no rales.  Abdominal: Soft. Bowel sounds are normal. He exhibits no mass. There is no tenderness. Hernia confirmed negative in the right inguinal area and confirmed negative in the left inguinal area.  Genitourinary: Rectum normal, prostate normal, testes normal and penis normal. Rectal exam shows no external hemorrhoid, no internal hemorrhoid, no fissure, no mass, no tenderness, anal tone normal and guaiac negative stool. Prostate is not enlarged and not tender. Right testis shows no mass, no swelling and no tenderness. Left testis shows  no mass, no swelling and no tenderness. Circumcised. No penile erythema or penile tenderness. No discharge found.  Musculoskeletal: Normal range of motion. He exhibits no edema, tenderness or deformity.  Lymphadenopathy:    He has no cervical adenopathy. No inguinal adenopathy noted on the right or left side.  Neurological: He is alert and oriented to person, place, and time.  Skin: Skin is warm and dry. He is not  diaphoretic.  Psychiatric: He has a normal mood and affect. His behavior is normal. Judgment and thought content normal.  Vitals reviewed.   Lab Results  Component Value Date   WBC 5.3 05/22/2017   HGB 15.0 05/22/2017   HCT 43.8 05/22/2017   PLT 228.0 05/22/2017   GLUCOSE 96 05/22/2017   CHOL 173 05/22/2017   TRIG 96.0 05/22/2017   HDL 39.10 05/22/2017   LDLCALC 115 (H) 05/22/2017   ALT 23 05/22/2017   AST 13 05/22/2017   NA 140 05/22/2017   K 4.2 05/22/2017   CL 105 05/22/2017   CREATININE 1.02 05/22/2017   BUN 13 05/22/2017   CO2 28 05/22/2017   TSH 0.72 03/28/2016   PSA 1.23 05/22/2017   HGBA1C 5.4 05/22/2017    No results found.  Assessment & Plan:   Matthew Grant was seen today for hypertension and annual exam.  Diagnoses and all orders for this visit:  Essential hypertension, benign- His blood pressure is well controlled on the ARB.  Electrolytes and renal function are normal.  His EKG is negative for LVH or ischemia. -     Comprehensive metabolic panel; Future -     EKG 12-Lead  GERD with esophagitis- His symptoms are well controlled.  Medical therapy is not indicated. -     CBC with Differential/Platelet; Future  Routine general medical examination at a health care facility- Exam completed, labs reviewed, vaccines reviewed and updated, screening for colon cancer is up-to-date, patient education material was given. -     Lipid panel; Future -     PSA; Future  Hyperglycemia- His blood sugars are normal now. -     Hemoglobin A1c; Future  Need for Tdap vaccination -     Tdap vaccine greater than or equal to 7yo IM   I have discontinued Matthew Grant. Matthew Grant. "Matthew Grant"'s cetirizine, predniSONE, and triamcinolone. I am also having him maintain his telmisartan and aspirin.  No orders of the defined types were placed in this encounter.    Follow-up: Return in about 6 months (around 11/21/2017).  Scarlette Calico, MD

## 2017-09-21 ENCOUNTER — Other Ambulatory Visit: Payer: Self-pay | Admitting: Internal Medicine

## 2017-09-21 DIAGNOSIS — I10 Essential (primary) hypertension: Secondary | ICD-10-CM

## 2017-11-20 ENCOUNTER — Ambulatory Visit: Payer: 59 | Admitting: Internal Medicine

## 2017-11-20 ENCOUNTER — Encounter: Payer: Self-pay | Admitting: Internal Medicine

## 2017-11-20 VITALS — BP 142/88 | HR 70 | Temp 97.6°F | Resp 16 | Ht 76.0 in | Wt 272.0 lb

## 2017-11-20 DIAGNOSIS — I10 Essential (primary) hypertension: Secondary | ICD-10-CM

## 2017-11-20 DIAGNOSIS — Z23 Encounter for immunization: Secondary | ICD-10-CM

## 2017-11-20 DIAGNOSIS — N5201 Erectile dysfunction due to arterial insufficiency: Secondary | ICD-10-CM | POA: Diagnosis not present

## 2017-11-20 MED ORDER — AVANAFIL 200 MG PO TABS: 1.0000 | ORAL_TABLET | Freq: Every day | ORAL | 5 refills | Status: DC | PRN

## 2017-11-20 MED ORDER — TELMISARTAN 40 MG PO TABS: 40.0000 mg | ORAL_TABLET | Freq: Every day | ORAL | 1 refills | Status: DC

## 2017-11-20 NOTE — Progress Notes (Signed)
Subjective:  Patient ID: Matthew Grant., male    DOB: Aug 27, 1960  Age: 57 y.o. MRN: [id number]  CC: Hypertension    HPI Matthew Grant. presents for f/up - He complains of erectile dysfunction and wants to try a medication for this.  He thinks his blood pressure has been well controlled but he has not been monitoring his blood pressure.  He hass also lapsed on his lifestyle modifications and has gained weight since I last saw him.  He otherwise feels well and offers no other complaints.  Outpatient Medications Prior to Visit  Medication Sig Dispense Refill  . telmisartan (MICARDIS) 40 MG tablet TAKE 1 TABLET BY MOUTH DAILY 90 tablet 0   No facility-administered medications prior to visit.     ROS Review of Systems  Constitutional: Negative for diaphoresis and fatigue.  HENT: Negative.   Eyes: Negative for visual disturbance.  Respiratory: Negative for cough, chest tightness, shortness of breath and wheezing.   Cardiovascular: Negative.  Negative for chest pain, palpitations and leg swelling.  Gastrointestinal: Negative for abdominal pain, constipation, diarrhea, nausea and vomiting.  Genitourinary: Negative.  Negative for difficulty urinating and scrotal swelling.       +ED  Musculoskeletal: Negative.  Negative for arthralgias and myalgias.  Skin: Negative for color change.  Neurological: Negative.  Negative for dizziness, weakness and light-headedness.  Hematological: Negative for adenopathy. Does not bruise/bleed easily.  Psychiatric/Behavioral: Negative.     Objective:  BP (!) 142/88   Pulse 70   Temp 97.6 F (36.4 C) (Oral)   Resp 16   Ht 6\' 4"  (1.93 m)   Wt 272 lb (123.4 kg)   SpO2 95%   BMI 33.11 kg/m   BP Readings from Last 3 Encounters:  11/20/17 (!) 142/88  05/22/17 138/82  09/07/16 122/84    Wt Readings from Last 3 Encounters:  11/20/17 272 lb (123.4 kg)  05/22/17 268 lb 8 oz (121.8 kg)  09/07/16 263 lb (119.3 kg)    Physical Exam    Constitutional: He is oriented to person, place, and time. No distress.  HENT:  Mouth/Throat: Oropharynx is clear and moist. No oropharyngeal exudate.  Eyes: Conjunctivae are normal. No scleral icterus.  Neck: Normal range of motion. Neck supple. No JVD present. No thyromegaly present.  Cardiovascular: Normal rate, regular rhythm and normal heart sounds. Exam reveals no gallop and no friction rub.  No murmur heard. Pulmonary/Chest: Effort normal and breath sounds normal. He has no wheezes. He has no rhonchi. He has no rales.  Abdominal: Soft. Bowel sounds are normal. He exhibits no mass. There is no hepatosplenomegaly. There is no tenderness.  Musculoskeletal: Normal range of motion. He exhibits no edema, tenderness or deformity.  Lymphadenopathy:    He has no cervical adenopathy.  Neurological: He is alert and oriented to person, place, and time.  Skin: Skin is warm and dry. No rash noted. He is not diaphoretic.  Psychiatric: He has a normal mood and affect. His behavior is normal. Judgment and thought content normal.  Vitals reviewed.   Lab Results  Component Value Date   WBC 5.3 05/22/2017   HGB 15.0 05/22/2017   HCT 43.8 05/22/2017   PLT 228.0 05/22/2017   GLUCOSE 96 05/22/2017   CHOL 173 05/22/2017   TRIG 96.0 05/22/2017   HDL 39.10 05/22/2017   LDLCALC 115 (H) 05/22/2017   ALT 23 05/22/2017   AST 13 05/22/2017   NA 140 05/22/2017   K 4.2 05/22/2017  CL 105 05/22/2017   CREATININE 1.02 05/22/2017   BUN 13 05/22/2017   CO2 28 05/22/2017   TSH 0.72 03/28/2016   PSA 1.23 05/22/2017   HGBA1C 5.4 05/22/2017    No results found.  Assessment & Plan:   Matthew Grant was seen today for hypertension.  Diagnoses and all orders for this visit:  Essential hypertension, benign- His blood pressure is not quite adequately well controlled.  After discussion we decided that he would rather improve his lifestyle modifications than increase the dose of the telmisartan.  I will monitor  his electrolytes and renal function. -     Basic metabolic panel; Future -     telmisartan (MICARDIS) 40 MG tablet; Take 1 tablet (40 mg total) by mouth daily.  Erectile dysfunction due to arterial insufficiency -     Avanafil (STENDRA) 200 MG TABS; Take 1 tablet by mouth daily as needed.  Other orders -     Flu Vaccine QUAD 6+ mos PF IM (Fluarix Quad PF)   I have changed Matthew Grant. Matthew Grant. "Matthew Grant"'s telmisartan. I am also having him start on Avanafil.  Meds ordered this encounter  Medications  . Avanafil (STENDRA) 200 MG TABS    Sig: Take 1 tablet by mouth daily as needed.    Dispense:  8 tablet    Refill:  5  . telmisartan (MICARDIS) 40 MG tablet    Sig: Take 1 tablet (40 mg total) by mouth daily.    Dispense:  90 tablet    Refill:  1     Follow-up: Return in about 6 months (around 05/22/2018).  Scarlette Calico, MD

## 2017-11-20 NOTE — Patient Instructions (Signed)

## 2018-02-21 ENCOUNTER — Ambulatory Visit (INDEPENDENT_AMBULATORY_CARE_PROVIDER_SITE_OTHER)
Admission: RE | Admit: 2018-02-21 | Discharge: 2018-02-21 | Disposition: A | Discharge status: Home / Self Care | Payer: 59 | Source: Ambulatory Visit | Attending: Internal Medicine | Admitting: Internal Medicine

## 2018-02-21 ENCOUNTER — Other Ambulatory Visit (INDEPENDENT_AMBULATORY_CARE_PROVIDER_SITE_OTHER): Payer: 59

## 2018-02-21 ENCOUNTER — Ambulatory Visit: Payer: 59 | Admitting: Internal Medicine

## 2018-02-21 ENCOUNTER — Encounter: Payer: Self-pay | Admitting: Internal Medicine

## 2018-02-21 DIAGNOSIS — R1031 Right lower quadrant pain: Secondary | ICD-10-CM

## 2018-02-21 DIAGNOSIS — M545 Low back pain, unspecified: Secondary | ICD-10-CM

## 2018-02-21 DIAGNOSIS — K5900 Constipation, unspecified: Secondary | ICD-10-CM | POA: Diagnosis not present

## 2018-02-21 LAB — URINALYSIS, ROUTINE W REFLEX MICROSCOPIC
Bilirubin Urine: NEGATIVE
Hgb urine dipstick: NEGATIVE
Ketones, ur: NEGATIVE
Leukocytes, UA: NEGATIVE
Nitrite: NEGATIVE
RBC / HPF: NONE SEEN (ref 0–?)
Specific Gravity, Urine: 1.025 (ref 1.000–1.030)
Total Protein, Urine: NEGATIVE
Urine Glucose: NEGATIVE
Urobilinogen, UA: 0.2 (ref 0.0–1.0)
pH: 6 (ref 5.0–8.0)

## 2018-02-21 LAB — HEPATIC FUNCTION PANEL
ALT: 27 U/L (ref 0–53)
AST: 14 U/L (ref 0–37)
Albumin: 4.4 g/dL (ref 3.5–5.2)
Alkaline Phosphatase: 82 U/L (ref 39–117)
Bilirubin, Direct: 0.1 mg/dL (ref 0.0–0.3)
Total Bilirubin: 0.6 mg/dL (ref 0.2–1.2)
Total Protein: 6.9 g/dL (ref 6.0–8.3)

## 2018-02-21 LAB — CBC WITH DIFFERENTIAL/PLATELET
Basophils Absolute: 0.1 10*3/uL (ref 0.0–0.1)
Basophils Relative: 1.1 % (ref 0.0–3.0)
Eosinophils Absolute: 0.1 10*3/uL (ref 0.0–0.7)
Eosinophils Relative: 1.5 % (ref 0.0–5.0)
HEMATOCRIT: 45.2 % (ref 39.0–52.0)
Hemoglobin: 15.4 g/dL (ref 13.0–17.0)
Lymphocytes Relative: 37 % (ref 12.0–46.0)
Lymphs Abs: 2.3 10*3/uL (ref 0.7–4.0)
MCHC: 34.1 g/dL (ref 30.0–36.0)
MCV: 84.7 fl (ref 78.0–100.0)
MONOS PCT: 5.7 % (ref 3.0–12.0)
Monocytes Absolute: 0.3 10*3/uL (ref 0.1–1.0)
Neutro Abs: 3.4 10*3/uL (ref 1.4–7.7)
Neutrophils Relative %: 54.7 % (ref 43.0–77.0)
Platelets: 234 10*3/uL (ref 150.0–400.0)
RBC: 5.34 Mil/uL (ref 4.22–5.81)
RDW: 14.3 % (ref 11.5–15.5)
WBC: 6.2 10*3/uL (ref 4.0–10.5)

## 2018-02-21 LAB — LIPASE: Lipase: 12 U/L (ref 11.0–59.0)

## 2018-02-21 LAB — BASIC METABOLIC PANEL
BUN: 15 mg/dL (ref 6–23)
CO2: 28 mEq/L (ref 19–32)
Calcium: 9 mg/dL (ref 8.4–10.5)
Chloride: 103 mEq/L (ref 96–112)
Creatinine, Ser: 0.95 mg/dL (ref 0.40–1.50)
GFR: 81.82 mL/min (ref >60.00)
Glucose, Bld: 93 mg/dL (ref 70–99)
POTASSIUM: 4.2 meq/L (ref 3.5–5.1)
SODIUM: 138 meq/L (ref 135–145)

## 2018-02-21 NOTE — Progress Notes (Signed)
Subjective:    Patient ID: Matthew Grant., male    DOB: February 28, 1960, 58 y.o.   MRN: [id number]  HPI  Here with right lower back pain x 2 wks, different he thinks from renal stone, but now seems to wrap around the right side.  Has hx of several renal stones.  Denies urinary symptoms such as dysuria, urgency, flank pain, hematuria or n/v, fever, chill, but has maybe some frequency but no blood. No fever, no real hx lumabr but is s/p cervcial disc surgury x 4 yrs.  Pt LBP without bowel or bladder change, fever, wt loss,  worsening LE pain/numbness/weakness, gait change or falls, though has had some constipation recently.  Past Medical History:  Diagnosis Date  . Anxiety 03/18/2016  . Diverticulosis   . Hypertension   . Kidney calculi   . Mitral valve prolapse    Past Surgical History:  Procedure Laterality Date  . KIDNEY STONE SURGERY  02-2014  . KNEE ARTHROSCOPY  1998   left  . NECK SURGERY  01-2013   herniated disc    reports that he has never smoked. He has never used smokeless tobacco. He reports that he does not drink alcohol or use drugs. family history includes Cancer in his father and mother; Colon cancer in his maternal grandfather and paternal grandfather; Heart disease in his father; Hyperlipidemia in his father and mother; Hypertension in his father and mother. Allergies  Allergen Reactions  . Iohexol Nausea And Vomiting     Desc: SEVERE NAUSEA AND VOMITING WITH IV CONTRAST   . Ivp Dye [Iodinated Diagnostic Agents] Nausea And Vomiting    SEVERE N/V   Current Outpatient Medications on File Prior to Visit  Medication Sig Dispense Refill  . telmisartan (MICARDIS) 40 MG tablet Take 1 tablet (40 mg total) by mouth daily. 90 tablet 1   No current facility-administered medications on file prior to visit.    Review of Systems  Constitutional: Negative for other unusual diaphoresis or sweats HENT: Negative for ear discharge or swelling Eyes: Negative for other worsening  visual disturbances Respiratory: Negative for stridor or other swelling  Gastrointestinal: Negative for worsening distension or other blood Genitourinary: Negative for retention or other urinary change Musculoskeletal: Negative for other MSK pain or swelling Skin: Negative for color change or other new lesions Neurological: Negative for worsening tremors and other numbness  Psychiatric/Behavioral: Negative for worsening agitation or other fatigue All other system neg per pt    Objective:   Physical Exam BP 122/84   Pulse 74   Temp 98.6 F (37 C) (Oral)   Ht 6\' 4"  (1.93 m)   Wt 268 lb (121.6 kg)   SpO2 92%   BMI 32.62 kg/m  VS noted, non toxic Constitutional: Pt appears in NAD HENT: Head: NCAT.  Right Ear: External ear normal.  Left Ear: External ear normal.  Eyes: . Pupils are equal, round, and reactive to light. Conjunctivae and EOM are normal Nose: without d/c or deformity Neck: Neck supple. Gross normal ROM Cardiovascular: Normal rate and regular rhythm.   Pulmonary/Chest: Effort normal and breath sounds without rales or wheezing.  Spine nontender in midline and paravertebral, no rash or swelling Abd:  Soft, ND, + BS, no organomegaly, though does have some mild tenderness localized in the RLQ with deep palpation, no guarding or rebound Neurological: Pt is alert. At baseline orientation, motor grossly intact Skin: Skin is warm. No rashes, other new lesions, no LE edema Psychiatric: Pt behavior is  normal without agitation         Assessment & Plan:

## 2018-02-21 NOTE — Patient Instructions (Signed)
Please continue all other medications as before, and refills have been done if requested.  Please have the pharmacy call with any other refills you may need.  Please continue your efforts at being more active, low cholesterol diet, and weight control.  You are otherwise up to date with prevention measures today.  Please keep your appointments with your specialists as you may have planned  Please go to the XRAY Department in the Basement (go straight as you get off the elevator) for the x-ray testing  Please go to the LAB in the Basement (turn left off the elevator) for the tests to be done today  You will be contacted by phone if any changes need to be made immediately.  Otherwise, you will receive a letter about your results with an explanation, but please check with MyChart first.  Please remember to sign up for MyChart if you have not done so, as this will be important to you in the future with finding out test results, communicating by private email, and scheduling acute appointments online when needed.  

## 2018-02-21 NOTE — Assessment & Plan Note (Signed)
?   Lumbar related neuritic pain to the RLQ, not clear but suggestive in light of hx of prior cervical spine surgury, declines tx for pain, will check ls spine films and neuro exam ok

## 2018-02-21 NOTE — Assessment & Plan Note (Signed)
?   Related to the back pain (neuritic?) vs constipation vs other even diverticulitis or appendix or renal stone; pt is non toxic and does not feel overall ill, will hold on urgent CT abd for now, and check labs as ordered; CT should be considered highly for any worsening pain, fever or other unsual symptoms

## 2018-05-31 ENCOUNTER — Emergency Department (HOSPITAL_COMMUNITY)
Admission: EM | Admit: 2018-05-31 | Discharge: 2018-05-31 | Disposition: A | Discharge status: Home / Self Care | Payer: 59 | Attending: Emergency Medicine | Admitting: Emergency Medicine

## 2018-05-31 ENCOUNTER — Other Ambulatory Visit: Payer: Self-pay

## 2018-05-31 ENCOUNTER — Encounter (HOSPITAL_COMMUNITY): Payer: Self-pay

## 2018-05-31 DIAGNOSIS — N368 Other specified disorders of urethra: Secondary | ICD-10-CM | POA: Diagnosis not present

## 2018-05-31 DIAGNOSIS — R112 Nausea with vomiting, unspecified: Secondary | ICD-10-CM | POA: Insufficient documentation

## 2018-05-31 DIAGNOSIS — N23 Unspecified renal colic: Secondary | ICD-10-CM | POA: Diagnosis not present

## 2018-05-31 DIAGNOSIS — Z87442 Personal history of urinary calculi: Secondary | ICD-10-CM | POA: Diagnosis not present

## 2018-05-31 DIAGNOSIS — R109 Unspecified abdominal pain: Secondary | ICD-10-CM | POA: Diagnosis not present

## 2018-05-31 DIAGNOSIS — I10 Essential (primary) hypertension: Secondary | ICD-10-CM | POA: Diagnosis not present

## 2018-05-31 DIAGNOSIS — R1031 Right lower quadrant pain: Secondary | ICD-10-CM | POA: Diagnosis present

## 2018-05-31 LAB — CBC WITH DIFFERENTIAL/PLATELET
Abs Immature Granulocytes: 0.03 10*3/uL (ref 0.00–0.07)
Basophils Absolute: 0.1 10*3/uL (ref 0.0–0.1)
Basophils Relative: 1 %
Eosinophils Absolute: 0.2 10*3/uL (ref 0.0–0.5)
Eosinophils Relative: 2 %
HCT: 44.5 % (ref 39.0–52.0)
Hemoglobin: 14.6 g/dL (ref 13.0–17.0)
Immature Granulocytes: 0 %
Lymphocytes Relative: 38 %
Lymphs Abs: 3.5 10*3/uL (ref 0.7–4.0)
MCH: 28.5 pg (ref 26.0–34.0)
MCHC: 32.8 g/dL (ref 30.0–36.0)
MCV: 86.9 fL (ref 80.0–100.0)
Monocytes Absolute: 0.7 10*3/uL (ref 0.1–1.0)
Monocytes Relative: 7 %
Neutro Abs: 4.7 10*3/uL (ref 1.7–7.7)
Neutrophils Relative %: 52 %
Platelets: 233 10*3/uL (ref 150–400)
RBC: 5.12 MIL/uL (ref 4.22–5.81)
RDW: 13.5 % (ref 11.5–15.5)
WBC: 9.1 10*3/uL (ref 4.0–10.5)
nRBC: 0 % (ref 0.0–0.2)

## 2018-05-31 LAB — URINALYSIS, ROUTINE W REFLEX MICROSCOPIC
Bacteria, UA: NONE SEEN
Bilirubin Urine: NEGATIVE
Glucose, UA: NEGATIVE mg/dL
Ketones, ur: NEGATIVE mg/dL
Leukocytes,Ua: NEGATIVE
Nitrite: NEGATIVE
Protein, ur: NEGATIVE mg/dL
Specific Gravity, Urine: 1.024 (ref 1.005–1.030)
pH: 5 (ref 5.0–8.0)

## 2018-05-31 LAB — BASIC METABOLIC PANEL
Anion gap: 12 (ref 5–15)
BUN: 18 mg/dL (ref 6–20)
CO2: 26 mmol/L (ref 22–32)
Calcium: 8.7 mg/dL — ABNORMAL LOW (ref 8.9–10.3)
Chloride: 102 mmol/L (ref 98–111)
Creatinine, Ser: 1.13 mg/dL (ref 0.61–1.24)
GFR calc Af Amer: >60 mL/min (ref >60)
GFR calc non Af Amer: >60 mL/min (ref >60)
Glucose, Bld: 134 mg/dL — ABNORMAL HIGH (ref 70–99)
Potassium: 3.9 mmol/L (ref 3.5–5.1)
Sodium: 140 mmol/L (ref 135–145)

## 2018-05-31 MED ORDER — OXYCODONE HCL 5 MG PO TABS
5.0000 mg | ORAL_TABLET | Freq: Once | ORAL | Status: AC
  Administered 2018-05-31: 5 mg via ORAL
  Filled 2018-05-31: qty 1

## 2018-05-31 MED ORDER — OXYCODONE-ACETAMINOPHEN 5-325 MG PO TABS
1.0000 | ORAL_TABLET | Freq: Once | ORAL | Status: AC
  Administered 2018-05-31: 1 via ORAL
  Filled 2018-05-31: qty 1

## 2018-05-31 MED ORDER — KETOROLAC TROMETHAMINE 15 MG/ML IJ SOLN
15.0000 mg | Freq: Once | INTRAMUSCULAR | Status: AC
  Administered 2018-05-31: 15 mg via INTRAVENOUS
  Filled 2018-05-31: qty 1

## 2018-05-31 MED ORDER — TAMSULOSIN HCL 0.4 MG PO CAPS
0.4000 mg | ORAL_CAPSULE | Freq: Once | ORAL | Status: AC
  Administered 2018-05-31: 0.4 mg via ORAL
  Filled 2018-05-31: qty 1

## 2018-05-31 MED ORDER — OXYCODONE-ACETAMINOPHEN 5-325 MG PO TABS: 1.0000 | ORAL_TABLET | Freq: Four times a day (QID) | ORAL | 0 refills | Status: DC | PRN

## 2018-05-31 MED ORDER — HYDROMORPHONE HCL 1 MG/ML IJ SOLN
0.5000 mg | Freq: Once | INTRAMUSCULAR | Status: AC
  Administered 2018-05-31: 0.5 mg via INTRAVENOUS
  Filled 2018-05-31: qty 1

## 2018-05-31 MED ORDER — TAMSULOSIN HCL 0.4 MG PO CAPS: 0.4000 mg | ORAL_CAPSULE | Freq: Every day | ORAL | 0 refills | Status: DC

## 2018-05-31 MED ORDER — ONDANSETRON 4 MG PO TBDP: 4.0000 mg | ORAL_TABLET | Freq: Three times a day (TID) | ORAL | 0 refills | Status: DC | PRN

## 2018-05-31 MED ORDER — ONDANSETRON HCL 4 MG/2ML IJ SOLN
4.0000 mg | Freq: Once | INTRAMUSCULAR | Status: AC
  Administered 2018-05-31: 4 mg via INTRAVENOUS
  Filled 2018-05-31: qty 2

## 2018-05-31 NOTE — ED Provider Notes (Signed)
[facility] EMERGENCY DEPARTMENT Provider Note   CSN: [id number] Arrival date & time: 05/31/18  0256    History   Chief Complaint Chief Complaint  Patient presents with  . Flank Pain    R side    HPI Matthew Grant. is a 58 y.o. male.     Patient with history of kidney stones, requiring stone retrieval x1 in the past --presents with abrupt onset of right-sided flank pain with radiation to the right upper abdomen starting at approximately 12:30 AM.  Patient states that the pain feels similar to previous although he does not recall having as much pain in the upper abdomen.  He started drinking a lot of water to "flush the stone".  He then had several episodes of vomiting and decided to come to the emergency department.  He is not having any urinary symptoms including hematuria, increased frequency urgency, or dysuria.  He has not had any fevers or chills.  No chest pain or shortness of breath.  The onset of this condition was acute. The course is constant. Aggravating factors: none. Alleviating factors: none.       Past Medical History:  Diagnosis Date  . Anxiety 03/18/2016  . Diverticulosis   . Hypertension   . Kidney calculi   . Mitral valve prolapse     Patient Active Problem List   Diagnosis Date Noted  . Low back pain 02/21/2018  . RLQ abdominal pain 02/21/2018  . Erectile dysfunction due to arterial insufficiency 11/20/2017  . Hyperglycemia 05/22/2017  . Allergic rhinitis 09/10/2016  . Obstructive sleep apnea 05/24/2016  . Insomnia 05/24/2016  . GERD with esophagitis 03/28/2016  . Hypersomnolence 03/18/2016  . Anxiety 03/18/2016  . Abnormal stress electrocardiogram test using treadmill 07/15/2015  . Essential hypertension, benign 05/22/2012  . Nonspecific abnormal electrocardiogram (ECG) (EKG) 05/22/2012  . Routine general medical examination at a health care facility 10/31/2011    Past Surgical History:  Procedure Laterality Date  .  KIDNEY STONE SURGERY  02-2014  . KNEE ARTHROSCOPY  1998   left  . NECK SURGERY  01-2013   herniated disc        Home Medications    Prior to Admission medications   Medication Sig Start Date End Date Taking? Authorizing Provider  telmisartan (MICARDIS) 40 MG tablet Take 1 tablet (40 mg total) by mouth daily. 11/20/17   Janith Lima, MD    Family History Family History  Problem Relation Age of Onset  . Cancer Father   . Hypertension Father   . Hyperlipidemia Father   . Heart disease Father   . Cancer Mother   . Hypertension Mother   . Hyperlipidemia Mother   . Colon cancer Maternal Grandfather   . Colon cancer Paternal Grandfather   . Stomach cancer Neg Hx     Social History Social History   Tobacco Use  . Smoking status: Never Smoker  . Smokeless tobacco: Never Used  Substance Use Topics  . Alcohol use: No    Alcohol/week: 0.0 standard drinks  . Drug use: No     Allergies   Iohexol and Ivp dye [iodinated diagnostic agents]   Review of Systems Review of Systems  Constitutional: Negative for fever.  HENT: Negative for rhinorrhea and sore throat.   Eyes: Negative for redness.  Respiratory: Negative for cough.   Cardiovascular: Negative for chest pain.  Gastrointestinal: Positive for nausea and vomiting. Negative for abdominal pain and diarrhea.  Genitourinary: Positive for flank  pain. Negative for dysuria, frequency, hematuria and urgency.  Musculoskeletal: Negative for myalgias.  Skin: Negative for rash.  Neurological: Negative for headaches.     Physical Exam Updated Vital Signs Temp 98.7 F (37.1 C) (Oral)   Ht 6\' 4"  (1.93 m)   Wt 122.5 kg   BMI 32.87 kg/m   Physical Exam Vitals signs and nursing note reviewed.  Constitutional:      General: He is in acute distress.     Appearance: He is well-developed.     Comments: Uncomfortable appearing, wincing in pain at several points during interview.  HENT:     Head: Normocephalic and  atraumatic.  Eyes:     General:        Right eye: No discharge.        Left eye: No discharge.     Conjunctiva/sclera: Conjunctivae normal.  Neck:     Musculoskeletal: Normal range of motion and neck supple.  Cardiovascular:     Rate and Rhythm: Normal rate and regular rhythm.     Heart sounds: Normal heart sounds.  Pulmonary:     Effort: Pulmonary effort is normal.     Breath sounds: Normal breath sounds.  Abdominal:     Palpations: Abdomen is soft.     Tenderness: There is no abdominal tenderness. There is no guarding or rebound.  Skin:    General: Skin is warm and dry.  Neurological:     Mental Status: He is alert.      ED Treatments / Results  Labs (all labs ordered are listed, but only abnormal results are displayed) Labs Reviewed  BASIC METABOLIC PANEL - Abnormal; Notable for the following components:      Result Value   Glucose, Bld 134 (*)    Calcium 8.7 (*)    All other components within normal limits  URINALYSIS, ROUTINE W REFLEX MICROSCOPIC - Abnormal; Notable for the following components:   Hgb urine dipstick MODERATE (*)    All other components within normal limits  CBC WITH DIFFERENTIAL/PLATELET    EKG None  Radiology No results found.  Procedures Procedures (including critical care time)  Medications Ordered in ED Medications  HYDROmorphone (DILAUDID) injection 0.5 mg (0.5 mg Intravenous Given 05/31/18 0314)  ondansetron (ZOFRAN) injection 4 mg (4 mg Intravenous Given 05/31/18 0314)  HYDROmorphone (DILAUDID) injection 0.5 mg (0.5 mg Intravenous Given 05/31/18 0356)  oxyCODONE-acetaminophen (PERCOCET/ROXICET) 5-325 MG per tablet 1 tablet (1 tablet Oral Given 05/31/18 0435)  tamsulosin (FLOMAX) capsule 0.4 mg (0.4 mg Oral Given 05/31/18 0435)  ketorolac (TORADOL) 15 MG/ML injection 15 mg (15 mg Intravenous Given 05/31/18 0436)     Initial Impression / Assessment and Plan / ED Course  I have reviewed the triage vital signs and the nursing notes.   Pertinent labs & imaging results that were available during my care of the patient were reviewed by me and considered in my medical decision making (see chart for details).        Patient seen and examined. Work-up initiated. Medications ordered.  Labs and UA ordered.  Given history and presentation, suspect right-sided ureteral stone.  Will ensure no signs of UTI.  Vital signs reviewed and are as follows: BP (!) 142/101   Pulse 72   Temp 98.7 F (37.1 C) (Oral)   Ht 6\' 4"  (1.93 m)   Wt 122.5 kg   SpO2 98%   BMI 32.87 kg/m   EMERGENCY DEPARTMENT US RENAL EXAM  "Study: Limited Retroperitoneal Ultrasound of Kidneys"  INDICATIONS: Flank pain Long and short axis of both kidneys were obtained.   PERFORMED BY: Myself IMAGES ARCHIVED?: Yes LIMITATIONS: None VIEWS USED: Long axis  INTERPRETATION: No Hydronephrosis, L renal cyst noted, present in 05/2016  4:30 AM UA shows positive hemoglobin, 21-50 red blood cells, no signs of infection otherwise.  Patient with normal renal function.  Bedside ultrasound performed as above.  Patient discussed with and seen by Dr. Leonette Monarch.  We will give oral pain medication, Flomax, dose of IV Toradol.  Patient is doing well, anticipate discharge home.  5:16 AM Patient states it feels like his pain is 'trying to come back'. Will continue to monitor.   6:30 AM patient is feeling good and is ready for discharged home.  Patient counseled on kidney stone treatment. Urged patient to strain urine and save any stones. Urged urology follow-up and return to [facility] with any complications. Counseled patient to maintain good fluid intake.   Counseled patient on use of Flomax.   Patient counseled on use of narcotic pain medications. Counseled not to combine these medications with others containing tylenol. Urged not to drink alcohol, drive, or perform any other activities that requires focus while taking these medications. The patient verbalizes understanding and  agrees with the plan.   Final Clinical Impressions(s) / ED Diagnoses   Final diagnoses:  Ureteral colic   Patient with history of kidney stones with abrupt onset of right sided flank pain consistent with previous stones.  UA demonstrates hematuria without signs of infection.  Normal kidney function tonight.  Pain is appropriately controlled in the emergency department.  He does not have any significant abdominal tenderness.  I suspect that he has a right-sided ureteral stone.  We will have him follow-up with urology for further evaluation and treatment.  Do not suspect cholecystitis, cholelithiasis, pancreatitis, small bowel obstruction, colitis, enteritis, appendicitis, perforated viscus, aortic dissection or enlarging aortic aneurysm at this time.  Given his previous history of stones and consistent findings today, do not feel that he needs advanced imaging.  Bedside ultrasound does not demonstrate significant hydronephrosis on the right.  ED Discharge Orders         Ordered    oxyCODONE-acetaminophen (PERCOCET/ROXICET) 5-325 MG tablet  Every 6 hours PRN     05/31/18 0647    ondansetron (ZOFRAN ODT) 4 MG disintegrating tablet  Every 8 hours PRN     05/31/18 0647    tamsulosin (FLOMAX) 0.4 MG CAPS capsule  Daily     05/31/18 0647           Carlisle Cater, PA-C 05/31/18 0748    Fatima Blank, MD 06/03/18 [phone]

## 2018-05-31 NOTE — ED Triage Notes (Signed)
Pt stated that he had pain 1230a and began vomiting; pt has hx of kidney stones and pain was abrupt.

## 2018-05-31 NOTE — Discharge Instructions (Addendum)
Please read and follow all provided instructions.  Your diagnoses today include:  1. Ureteral colic     Tests performed today include:  Urine test that showed blood in your urine and no infection  Bedside ultrasound which showed a cyst on the left kidney -- this was present on an ultrasound done in 5/18  Blood test that showed normal kidney function  Vital signs. See below for your results today.   Medications prescribed:   Percocet (oxycodone/acetaminophen) - narcotic pain medication  DO NOT drive or perform any activities that require you to be awake and alert because this medicine can make you drowsy. BE VERY CAREFUL not to take multiple medicines containing Tylenol (also called acetaminophen). Doing so can lead to an overdose which can damage your liver and cause liver failure and possibly death.   Zofran (ondansetron) - for nausea and vomiting   Flomax (tamsulosin) - relaxes smooth muscle to help kidney stones pass   Naproxen - anti-inflammatory pain medication  Do not exceed 500mg  naproxen every 12 hours, take with food  You have been prescribed an anti-inflammatory medication or NSAID. Take with food. Take smallest effective dose for the shortest duration needed for your pain. Stop taking if you experience stomach pain or vomiting.   Take any prescribed medications only as directed.  Home care instructions:  Follow any educational materials contained in this packet.  Please double your fluid intake for the next several days. Strain your urine and save any stones that may pass.   BE VERY CAREFUL not to take multiple medicines containing Tylenol (also called acetaminophen). Doing so can lead to an overdose which can damage your liver and cause liver failure and possibly death.   Follow-up instructions: Please follow-up with your urologist or the urologist referral (provided on front page) in the next 1 week for further evaluation of your symptoms.  Return  instructions:  If you need to return to the Emergency Department, go to [facility] and not [facility]. The urologists are located at [facility] and can better care for you at this location.   Please return to the Emergency Department if you experience worsening symptoms.   Please return if you develop fever or uncontrolled pain or vomiting.  Please return if you have any other emergent concerns.  Additional Information:  Your vital signs today were: BP (!) 142/101    Pulse 72    Temp 98.7 F (37.1 C) (Oral)    Ht 6\' 4"  (1.93 m)    Wt 122.5 kg    SpO2 98%    BMI 32.87 kg/m  If your blood pressure (BP) was elevated above 135/85 this visit, please have this repeated by your doctor within one month. --------------

## 2018-06-02 DIAGNOSIS — M25572 Pain in left ankle and joints of left foot: Secondary | ICD-10-CM | POA: Diagnosis not present

## 2018-06-11 DIAGNOSIS — M25572 Pain in left ankle and joints of left foot: Secondary | ICD-10-CM | POA: Diagnosis not present

## 2018-08-13 ENCOUNTER — Other Ambulatory Visit: Payer: Self-pay | Admitting: Internal Medicine

## 2018-08-13 DIAGNOSIS — I10 Essential (primary) hypertension: Secondary | ICD-10-CM

## 2018-08-15 ENCOUNTER — Encounter: Payer: Self-pay | Admitting: Internal Medicine

## 2018-08-16 ENCOUNTER — Other Ambulatory Visit: Payer: Self-pay | Admitting: Internal Medicine

## 2018-08-16 DIAGNOSIS — I10 Essential (primary) hypertension: Secondary | ICD-10-CM

## 2018-08-16 MED ORDER — TELMISARTAN 40 MG PO TABS: 40.0000 mg | ORAL_TABLET | Freq: Every day | ORAL | 1 refills | Status: DC

## 2018-11-06 ENCOUNTER — Encounter: Payer: Self-pay | Admitting: Internal Medicine

## 2018-11-06 DIAGNOSIS — Z20822 Contact with and (suspected) exposure to covid-19: Secondary | ICD-10-CM

## 2018-11-07 ENCOUNTER — Other Ambulatory Visit: Payer: Self-pay

## 2018-11-07 DIAGNOSIS — Z20822 Contact with and (suspected) exposure to covid-19: Secondary | ICD-10-CM

## 2018-11-09 LAB — NOVEL CORONAVIRUS, NAA: SARS-CoV-2, NAA: NOT DETECTED

## 2019-01-11 ENCOUNTER — Other Ambulatory Visit: Payer: Self-pay

## 2019-01-11 ENCOUNTER — Encounter (HOSPITAL_COMMUNITY): Payer: Self-pay | Admitting: Emergency Medicine

## 2019-01-11 ENCOUNTER — Emergency Department (HOSPITAL_COMMUNITY)
Admission: EM | Admit: 2019-01-11 | Discharge: 2019-01-12 | Disposition: A | Discharge status: Home / Self Care | Payer: 59 | Attending: Emergency Medicine | Admitting: Emergency Medicine

## 2019-01-11 ENCOUNTER — Emergency Department (HOSPITAL_COMMUNITY): Payer: 59

## 2019-01-11 DIAGNOSIS — Z79899 Other long term (current) drug therapy: Secondary | ICD-10-CM | POA: Insufficient documentation

## 2019-01-11 DIAGNOSIS — Z7982 Long term (current) use of aspirin: Secondary | ICD-10-CM | POA: Insufficient documentation

## 2019-01-11 DIAGNOSIS — I10 Essential (primary) hypertension: Secondary | ICD-10-CM | POA: Insufficient documentation

## 2019-01-11 DIAGNOSIS — R079 Chest pain, unspecified: Secondary | ICD-10-CM | POA: Diagnosis present

## 2019-01-11 DIAGNOSIS — R002 Palpitations: Secondary | ICD-10-CM | POA: Insufficient documentation

## 2019-01-11 LAB — BASIC METABOLIC PANEL
Anion gap: 10 (ref 5–15)
BUN: 13 mg/dL (ref 6–20)
CO2: 27 mmol/L (ref 22–32)
Calcium: 9 mg/dL (ref 8.9–10.3)
Chloride: 105 mmol/L (ref 98–111)
Creatinine, Ser: 1.34 mg/dL — ABNORMAL HIGH (ref 0.61–1.24)
GFR calc Af Amer: >60 mL/min (ref >60)
GFR calc non Af Amer: 58 mL/min — ABNORMAL LOW (ref >60)
Glucose, Bld: 87 mg/dL (ref 70–99)
Potassium: 3.9 mmol/L (ref 3.5–5.1)
Sodium: 142 mmol/L (ref 135–145)

## 2019-01-11 LAB — CBC
HCT: 47 % (ref 39.0–52.0)
Hemoglobin: 15.5 g/dL (ref 13.0–17.0)
MCH: 29.1 pg (ref 26.0–34.0)
MCHC: 33 g/dL (ref 30.0–36.0)
MCV: 88.2 fL (ref 80.0–100.0)
Platelets: 266 10*3/uL (ref 150–400)
RBC: 5.33 MIL/uL (ref 4.22–5.81)
RDW: 13.2 % (ref 11.5–15.5)
WBC: 8.6 10*3/uL (ref 4.0–10.5)
nRBC: 0 % (ref 0.0–0.2)

## 2019-01-11 LAB — TROPONIN I (HIGH SENSITIVITY)
Troponin I (High Sensitivity): 6 ng/L (ref <18)
Troponin I (High Sensitivity): 5 ng/L (ref <18)

## 2019-01-11 NOTE — ED Notes (Signed)
Pt threw blood pressure cuff in trash can and stated "im going to [facility]". This tech informed pt that his name has been placed in a room and we are waiting for it to be clean. Pt remains in waiting room at this time.

## 2019-01-11 NOTE — ED Notes (Signed)
Please have patient call his wife as soon as he gets a room--Matthew Grant

## 2019-01-11 NOTE — ED Triage Notes (Signed)
Patient states over the past few nights he has been woken from fast heart rate along with chest tightness. C/o left sided chest pain radiating into his left arm and throat. Pain 2/10. Also feeling light headed with a near syncopal episode PTA.

## 2019-01-12 NOTE — ED Provider Notes (Signed)
[facility] EMERGENCY DEPARTMENT Provider Note  CSN: [id number] Arrival date & time: 01/11/19 1515  Chief Complaint(s) Chest Pain  HPI Matthew Grant. is a 58 y.o. male with a past medical history listed below who presents to the emergency department with several weeks of intermittent palpitations typically noted while lying down at night.  These have increased in frequency this week.  These episodes last 1 to several minutes and resolve spontaneously.  Today he had 2 severe episodes where he fell associated chest pressure and shortness of breath.  Patient also felt as if he was going to pass out.  He denies any recent fevers or infections.  No coughing or congestion.  Currently has some mild soreness.  He does report that he likely does not hydrate as well as he should.  Reports that he drinks mostly soft drinks.  Denies any new medication.  HPI  Past Medical History Past Medical History:  Diagnosis Date  . Anxiety 03/18/2016  . Diverticulosis   . Hypertension   . Kidney calculi   . Mitral valve prolapse    Patient Active Problem List   Diagnosis Date Noted  . Low back pain 02/21/2018  . RLQ abdominal pain 02/21/2018  . Erectile dysfunction due to arterial insufficiency 11/20/2017  . Hyperglycemia 05/22/2017  . Allergic rhinitis 09/10/2016  . Obstructive sleep apnea 05/24/2016  . Insomnia 05/24/2016  . GERD with esophagitis 03/28/2016  . Hypersomnolence 03/18/2016  . Anxiety 03/18/2016  . Abnormal stress electrocardiogram test using treadmill 07/15/2015  . Essential hypertension, benign 05/22/2012  . Nonspecific abnormal electrocardiogram (ECG) (EKG) 05/22/2012  . Routine general medical examination at a health care facility 10/31/2011   Home Medication(s) Prior to Admission medications   Medication Sig Start Date End Date Taking? Authorizing Provider  aspirin EC 81 MG tablet Take 81 mg by mouth daily.   Yes [provider]  telmisartan  (MICARDIS) 40 MG tablet Take 1 tablet (40 mg total) by mouth daily. 08/16/18  Yes Janith Lima, MD  ondansetron (ZOFRAN ODT) 4 MG disintegrating tablet Take 1 tablet (4 mg total) by mouth every 8 (eight) hours as needed for nausea or vomiting. Patient not taking: Reported on 01/12/2019 05/31/18   Carlisle Cater, PA-C  oxyCODONE-acetaminophen (PERCOCET/ROXICET) 5-325 MG tablet Take 1 tablet by mouth every 6 (six) hours as needed for severe pain. Patient not taking: Reported on 01/12/2019 05/31/18   Carlisle Cater, PA-C  tamsulosin (FLOMAX) 0.4 MG CAPS capsule Take 1 capsule (0.4 mg total) by mouth daily. Patient not taking: Reported on 01/12/2019 05/31/18   Carlisle Cater, PA-C                                                                                                                                    Past Surgical History Past Surgical History:  Procedure Laterality Date  . KIDNEY STONE SURGERY  02-2014  . KNEE ARTHROSCOPY  1998   left  . NECK SURGERY  01-2013   herniated disc   Family History Family History  Problem Relation Age of Onset  . Cancer Father   . Hypertension Father   . Hyperlipidemia Father   . Heart disease Father   . Cancer Mother   . Hypertension Mother   . Hyperlipidemia Mother   . Colon cancer Maternal Grandfather   . Colon cancer Paternal Grandfather   . Stomach cancer Neg Hx     Social History Social History   Tobacco Use  . Smoking status: Never Smoker  . Smokeless tobacco: Never Used  Substance Use Topics  . Alcohol use: No    Alcohol/week: 0.0 standard drinks  . Drug use: No   Allergies Iohexol and Ivp dye [iodinated diagnostic agents]  Review of Systems Review of Systems All other systems are reviewed and are negative for acute change except as noted in the HPI  Physical Exam Vital Signs  I have reviewed the triage vital signs BP (!) 149/99 (BP Location: Right Arm)   Pulse 69   Temp 98.6 F (37 C) (Oral)   Resp 18   SpO2 100%    Physical Exam Vitals reviewed.  Constitutional:      General: He is not in acute distress.    Appearance: He is well-developed. He is not diaphoretic.  HENT:     Head: Normocephalic and atraumatic.     Nose: Nose normal.  Eyes:     General: No scleral icterus.       Right eye: No discharge.        Left eye: No discharge.     Conjunctiva/sclera: Conjunctivae normal.     Pupils: Pupils are equal, round, and reactive to light.  Cardiovascular:     Rate and Rhythm: Normal rate and regular rhythm.     Heart sounds: No murmur. No friction rub. No gallop.   Pulmonary:     Effort: Pulmonary effort is normal. No respiratory distress.     Breath sounds: Normal breath sounds. No stridor. No rales.  Abdominal:     General: There is no distension.     Palpations: Abdomen is soft.     Tenderness: There is no abdominal tenderness.  Musculoskeletal:        General: No tenderness.     Cervical back: Normal range of motion and neck supple.  Skin:    General: Skin is warm and dry.     Findings: No erythema or rash.  Neurological:     Mental Status: He is alert and oriented to person, place, and time.     ED Results and Treatments Labs (all labs ordered are listed, but only abnormal results are displayed) Labs Reviewed  BASIC METABOLIC PANEL - Abnormal; Notable for the following components:      Result Value   Creatinine, Ser 1.34 (*)    GFR calc non Af Amer 58 (*)    All other components within normal limits  CBC  TROPONIN I (HIGH SENSITIVITY)  TROPONIN I (HIGH SENSITIVITY)  EKG  EKG Interpretation  Date/Time:  Friday January 11 2019 15:22:22 EST Ventricular Rate:  111 PR Interval:  146 QRS Duration: 88 QT Interval:  348 QTC Calculation: 473 R Axis:   61 Text Interpretation: Sinus tachycardia Abnormal ECG No STEMI Otherwise no significant change  Reconfirmed by Addison Lank [phone]) on 01/11/2019 11:54:00 PM      Radiology DG Chest 2 View  Result Date: 01/11/2019 CLINICAL DATA:  Chest pain EXAM: CHEST - 2 VIEW COMPARISON:  03/23/2016 FINDINGS: Heart and mediastinal contours are within normal limits. No focal opacities or effusions. No acute bony abnormality. IMPRESSION: No active cardiopulmonary disease. Electronically Signed   By: Rolm Baptise M.D.   On: 01/11/2019 16:12    Pertinent labs & imaging results that were available during my care of the patient were reviewed by me and considered in my medical decision making (see chart for details).  Medications Ordered in ED Medications - No data to display                                                                                                                                  Procedures Procedures  (including critical care time)  Medical Decision Making / ED Course I have reviewed the nursing notes for this encounter and the patient's prior records (if available in EHR or on provided paperwork).   Matthew Grant. was evaluated in Emergency Department on 01/12/2019 for the symptoms described in the history of present illness. He was evaluated in the context of the global COVID-19 pandemic, which necessitated consideration that the patient might be at risk for infection with the SARS-CoV-2 virus that causes COVID-19. Institutional protocols and algorithms that pertain to the evaluation of patients at risk for COVID-19 are in a state of rapid change based on information released by regulatory bodies including the CDC and federal and state organizations. These policies and algorithms were followed during the patient's care in the ED.  Patient presents with palpitations and associated chest pressure.  EKG without acute ischemic changes or evidence of pericarditis.  No acute dysrhythmias or blocks.  Patient had sinus tachycardia on EKG. currently with regular rate.  Labs without  electrolyte derangements.  Patient does have mild AKI.   Serial troponins were negative x2.  Feel this is sufficient to rule out ACS with a heart score less than 3.   Low suspicion for pulmonary embolism.  Presentation not classic for aortic dissection or esophageal perforation.  There are several possible etiologies for the patient's palpitation including dehydration, caffeine use.  He does have a history of mitral valve prolapse.  Reports that he already has an established follow-up with primary care provider.  Recommended he also follow-up with cardiology for Holter monitoring.  The patient appears reasonably screened and/or stabilized for discharge and I doubt any other medical condition or other [facility] requiring further screening, evaluation, or treatment in the ED at this time  prior to discharge.  The patient is safe for discharge with strict return precautions.       Final Clinical Impression(s) / ED Diagnoses Final diagnoses:  Intermittent palpitations    The patient appears reasonably screened and/or stabilized for discharge and I doubt any other medical condition or other [facility] requiring further screening, evaluation, or treatment in the ED at this time prior to discharge.  Disposition: Discharge  Condition: Good  I have discussed the results, Dx and Tx plan with the patient who expressed understanding and agree(s) with the plan. Discharge instructions discussed at great length. The patient was given strict return precautions who verbalized understanding of the instructions. No further questions at time of discharge.    ED Discharge Orders    None        Follow Up: Janith Lima, MD [address] [place] [address] [phone]  On 01/29/2019 as scheduled  [address], [place] [address] [phone]  Schedule an appointment as soon as possible for a visit  to set up for holter monitor      This  chart was dictated using voice recognition software.  Despite best efforts to proofread,  errors can occur which can change the documentation meaning.   Fatima Blank, MD 01/12/19 [phone]

## 2019-02-04 ENCOUNTER — Other Ambulatory Visit: Payer: Self-pay

## 2019-02-04 ENCOUNTER — Encounter: Payer: Self-pay | Admitting: Internal Medicine

## 2019-02-04 ENCOUNTER — Ambulatory Visit (INDEPENDENT_AMBULATORY_CARE_PROVIDER_SITE_OTHER): Payer: 59 | Admitting: Internal Medicine

## 2019-02-04 VITALS — BP 132/84 | HR 77 | Temp 98.5°F | Resp 16 | Ht 76.0 in | Wt 270.0 lb

## 2019-02-04 DIAGNOSIS — Z0000 Encounter for general adult medical examination without abnormal findings: Secondary | ICD-10-CM

## 2019-02-04 DIAGNOSIS — F320 Major depressive disorder, single episode, mild: Secondary | ICD-10-CM | POA: Insufficient documentation

## 2019-02-04 DIAGNOSIS — R000 Tachycardia, unspecified: Secondary | ICD-10-CM | POA: Diagnosis not present

## 2019-02-04 DIAGNOSIS — M170 Bilateral primary osteoarthritis of knee: Secondary | ICD-10-CM | POA: Diagnosis not present

## 2019-02-04 DIAGNOSIS — I471 Supraventricular tachycardia: Secondary | ICD-10-CM

## 2019-02-04 DIAGNOSIS — R0683 Snoring: Secondary | ICD-10-CM

## 2019-02-04 DIAGNOSIS — R195 Other fecal abnormalities: Secondary | ICD-10-CM | POA: Insufficient documentation

## 2019-02-04 DIAGNOSIS — I10 Essential (primary) hypertension: Secondary | ICD-10-CM | POA: Diagnosis not present

## 2019-02-04 LAB — BASIC METABOLIC PANEL
BUN: 19 mg/dL (ref 6–23)
CO2: 28 mEq/L (ref 19–32)
Calcium: 9.1 mg/dL (ref 8.4–10.5)
Chloride: 103 mEq/L (ref 96–112)
Creatinine, Ser: 1.07 mg/dL (ref 0.40–1.50)
GFR: 71.09 mL/min (ref >60.00)
Glucose, Bld: 103 mg/dL — ABNORMAL HIGH (ref 70–99)
Potassium: 4.3 mEq/L (ref 3.5–5.1)
Sodium: 137 mEq/L (ref 135–145)

## 2019-02-04 LAB — LIPID PANEL
Cholesterol: 185 mg/dL (ref 0–200)
HDL: 39.3 mg/dL (ref >39.00)
LDL Cholesterol: 119 mg/dL — ABNORMAL HIGH (ref 0–99)
NonHDL: 146
Total CHOL/HDL Ratio: 5
Triglycerides: 134 mg/dL (ref 0.0–149.0)
VLDL: 26.8 mg/dL (ref 0.0–40.0)

## 2019-02-04 LAB — TSH: TSH: 1.27 u[IU]/mL (ref 0.35–4.50)

## 2019-02-04 LAB — PSA: PSA: 1.29 ng/mL (ref 0.10–4.00)

## 2019-02-04 MED ORDER — VIIBRYD STARTER PACK 10 & 20 MG PO KIT: 1.0000 | PACK | Freq: Every day | ORAL | 0 refills | Status: DC

## 2019-02-04 MED ORDER — MELOXICAM 7.5 MG PO TABS: 7.5000 mg | ORAL_TABLET | Freq: Every day | ORAL | 0 refills | Status: DC

## 2019-02-04 NOTE — Patient Instructions (Signed)

## 2019-02-04 NOTE — Progress Notes (Signed)
Subjective:  Patient ID: Matthew Grant., male    DOB: 1960/12/06  Age: 59 y.o. MRN: [id number]  CC: Annual Exam, Hypertension, and Gastroesophageal Reflux   This visit occurred during the SARS-CoV-2 public health emergency.  Safety protocols were in place, including screening questions prior to the visit, additional usage of staff PPE, and extensive cleaning of exam room while observing appropriate contact time as indicated for disinfecting solutions.    HPI Matthew Grant. presents for a CPX.  About 3 weeks ago he developed the acute onset of palpitations.  He described an episode of elevated heart rate that was fast and pounding with lightheadedness.  In the ED he had an EKG that showed sinus tachycardia (111 bpm).  He has had no more episodes of palpitations but continues to complain of lightheadedness and dyspnea on exertion.  He was referred to cardiology but has not yet made an appointment.  He denies chest pain, diaphoresis, near syncope, or edema.  He has a history of MVP.  His wife complains about his snoring and says she has to nudge him sometimes at night.  Years ago he was referred for a sleep study but he elected not to do it.  He also complains of chronic bilateral knee pain with no recent injury.  Additionally he complains of a several month history of early morning awakening, irritability, and angry outbursts.  He denies SI, HI, feeling hopeless/worthless/or helpless.  He feels like he is under a lot of stress at work.   Outpatient Medications Prior to Visit  Medication Sig Dispense Refill  . aspirin EC 81 MG tablet Take 81 mg by mouth daily.    Marland Kitchen telmisartan (MICARDIS) 40 MG tablet Take 1 tablet (40 mg total) by mouth daily. 90 tablet 1  . ondansetron (ZOFRAN ODT) 4 MG disintegrating tablet Take 1 tablet (4 mg total) by mouth every 8 (eight) hours as needed for nausea or vomiting. (Patient not taking: Reported on 01/12/2019) 10 tablet 0  .  oxyCODONE-acetaminophen (PERCOCET/ROXICET) 5-325 MG tablet Take 1 tablet by mouth every 6 (six) hours as needed for severe pain. (Patient not taking: Reported on 01/12/2019) 8 tablet 0  . tamsulosin (FLOMAX) 0.4 MG CAPS capsule Take 1 capsule (0.4 mg total) by mouth daily. (Patient not taking: Reported on 01/12/2019) 7 capsule 0   No facility-administered medications prior to visit.    ROS Review of Systems  Constitutional: Positive for fatigue. Negative for appetite change, chills, diaphoresis, fever and unexpected weight change.  HENT: Negative.  Negative for sore throat, trouble swallowing and voice change.   Eyes: Negative for visual disturbance.  Respiratory: Positive for shortness of breath. Negative for cough, chest tightness and wheezing.        ++ loud snoring  Cardiovascular: Positive for palpitations. Negative for chest pain and leg swelling.  Gastrointestinal: Negative for abdominal pain, blood in stool, constipation, diarrhea, nausea and vomiting.  Endocrine: Negative.   Genitourinary: Negative.  Negative for difficulty urinating, dysuria, frequency, penile swelling, scrotal swelling, testicular pain and urgency.  Musculoskeletal: Positive for arthralgias. Negative for back pain, myalgias and neck pain.  Skin: Negative for color change, pallor and rash.  Neurological: Negative.  Negative for dizziness, weakness, light-headedness and headaches.  Hematological: Negative for adenopathy. Does not bruise/bleed easily.  Psychiatric/Behavioral: Positive for dysphoric mood and sleep disturbance. Negative for agitation, behavioral problems, confusion, decreased concentration, hallucinations, self-injury and suicidal ideas. The patient is not nervous/anxious and is not hyperactive.  Objective:  BP 132/84 (BP Location: Left Arm, Patient Position: Sitting, Cuff Size: Large)   Pulse 77   Temp 98.5 F (36.9 C) (Oral)   Resp 16   Ht '6\' 4"'  (1.93 m)   Wt 270 lb (122.5 kg)   SpO2 93%    BMI 32.87 kg/m   BP Readings from Last 3 Encounters:  02/04/19 132/84  01/12/19 (!) 137/97  05/31/18 122/83    Wt Readings from Last 3 Encounters:  02/04/19 270 lb (122.5 kg)  05/31/18 270 lb (122.5 kg)  02/21/18 268 lb (121.6 kg)    Physical Exam Vitals reviewed.  Constitutional:      General: He is not in acute distress.    Appearance: He is obese. He is not ill-appearing, toxic-appearing or diaphoretic.  HENT:     Nose: Nose normal.     Mouth/Throat:     Mouth: Mucous membranes are moist.  Eyes:     General: No scleral icterus.    Conjunctiva/sclera: Conjunctivae normal.  Cardiovascular:     Rate and Rhythm: Normal rate and regular rhythm.     Heart sounds: No murmur. No gallop.      Comments: EKG --  NSR 62 bpm No LVH Normal EKG Pulmonary:     Effort: Pulmonary effort is normal.     Breath sounds: No stridor. No wheezing, rhonchi or rales.  Abdominal:     General: Abdomen is protuberant. Bowel sounds are normal. There is no distension.     Palpations: There is no hepatomegaly or splenomegaly.     Hernia: No hernia is present. There is no hernia in the left inguinal area or right inguinal area.  Genitourinary:    Pubic Area: No rash.      Penis: Normal. No discharge, swelling or lesions.      Testes: Normal.        Right: Mass not present.        Left: Mass not present.     Epididymis:     Right: Normal. No mass.     Left: Normal. No mass.     Prostate: Normal. Not enlarged, not tender and no nodules present.     Rectum: Guaiac result positive. Internal hemorrhoid present. No mass, tenderness, anal fissure or external hemorrhoid. Normal anal tone.  Musculoskeletal:     Cervical back: Neck supple.     Right knee: Normal. No swelling, effusion, erythema, bony tenderness or crepitus. Normal range of motion. No tenderness.     Left knee: Normal. No swelling, effusion, erythema, bony tenderness or crepitus. Normal range of motion. No tenderness.     Right lower  leg: No edema.     Left lower leg: No edema.  Lymphadenopathy:     Cervical: No cervical adenopathy.     Lower Body: No right inguinal adenopathy. No left inguinal adenopathy.  Skin:    General: Skin is warm and dry.  Neurological:     General: No focal deficit present.     Mental Status: He is alert.  Psychiatric:        Mood and Affect: Mood normal.        Behavior: Behavior normal.     Lab Results  Component Value Date   WBC 8.6 01/11/2019   HGB 15.5 01/11/2019   HCT 47.0 01/11/2019   PLT 266 01/11/2019   GLUCOSE 103 (H) 02/04/2019   CHOL 185 02/04/2019   TRIG 134.0 02/04/2019   HDL 39.30 02/04/2019   LDLCALC 119 (H)  02/04/2019   ALT 27 02/21/2018   AST 14 02/21/2018   NA 137 02/04/2019   K 4.3 02/04/2019   CL 103 02/04/2019   CREATININE 1.07 02/04/2019   BUN 19 02/04/2019   CO2 28 02/04/2019   TSH 1.27 02/04/2019   PSA 1.29 02/04/2019   HGBA1C 5.4 05/22/2017    DG Chest 2 View  Result Date: 01/11/2019 CLINICAL DATA:  Chest pain EXAM: CHEST - 2 VIEW COMPARISON:  03/23/2016 FINDINGS: Heart and mediastinal contours are within normal limits. No focal opacities or effusions. No acute bony abnormality. IMPRESSION: No active cardiopulmonary disease. Electronically Signed   By: Rolm Baptise M.D.   On: 01/11/2019 16:12    Assessment & Plan:   Melquisedec was seen today for annual exam, hypertension and gastroesophageal reflux.  Diagnoses and all orders for this visit:  Essential hypertension, benign- His blood pressure is adequately well controlled.  Electrolytes and renal function are normal.  EKG is negative for LVH or ischemia. -     TSH -     Basic metabolic panel -     EKG 37-TGGY  Routine general medical examination at a health care facility- Exam completed, labs reviewed, vaccines reviewed and updated, patient education material was given. -     Lipid panel -     PSA  Loud snoring- I have asked him to be evaluated for sleep apnea. -     Ambulatory referral  to Sleep Studies  Paroxysmal sinus tachycardia ([facility])- He has had no recent recurrences of this.  Has requested, I referred him to cardiology.  At this time I do not think he would benefit from taking a rate controlling medication. -     Ambulatory referral to Cardiology  Primary osteoarthritis of both knees -     meloxicam (MOBIC) 7.5 MG tablet; Take 1 tablet (7.5 mg total) by mouth daily.  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 I anticipate increasing the dose over the next 2 to 3 weeks. -     Vilazodone HCl (VIIBRYD STARTER PACK) 10 & 20 MG KIT; Take 1 tablet by mouth daily.  Occult blood in stools- He has asymptomatic occult blood in the stools.  This is likely due to hemorrhoidal disease.  He had a colonoscopy 4 years ago that was positive for polyps.  He intends to do a repeat colonoscopy later this year.  Due to the COVID-19 pandemic he is not willing to do a colonoscopy sooner.   I have discontinued Theodoro Kalata. Dorice Lamas. "Bucky"'s oxyCODONE-acetaminophen, ondansetron, and tamsulosin. I am also having him start on meloxicam and Viibryd Starter Pack. Additionally, I am having him maintain his telmisartan and aspirin EC.  Meds ordered this encounter  Medications  . meloxicam (MOBIC) 7.5 MG tablet    Sig: Take 1 tablet (7.5 mg total) by mouth daily.    Dispense:  90 tablet    Refill:  0  . Vilazodone HCl (VIIBRYD STARTER PACK) 10 & 20 MG KIT    Sig: Take 1 tablet by mouth daily.    Dispense:  1 kit    Refill:  0     Follow-up: Return in about 6 months (around 08/04/2019).  Scarlette Calico, MD

## 2019-02-13 ENCOUNTER — Ambulatory Visit: Payer: 59 | Admitting: Neurology

## 2019-02-13 ENCOUNTER — Other Ambulatory Visit: Payer: Self-pay

## 2019-02-13 ENCOUNTER — Encounter: Payer: Self-pay | Admitting: Neurology

## 2019-02-13 VITALS — BP 119/78 | HR 61 | Temp 97.4°F | Ht 76.0 in | Wt 271.0 lb

## 2019-02-13 DIAGNOSIS — R002 Palpitations: Secondary | ICD-10-CM

## 2019-02-13 DIAGNOSIS — E669 Obesity, unspecified: Secondary | ICD-10-CM

## 2019-02-13 DIAGNOSIS — R0683 Snoring: Secondary | ICD-10-CM

## 2019-02-13 DIAGNOSIS — Z87898 Personal history of other specified conditions: Secondary | ICD-10-CM | POA: Diagnosis not present

## 2019-02-13 NOTE — Patient Instructions (Signed)

## 2019-02-13 NOTE — Progress Notes (Signed)
Subjective:    Patient ID: Matthew Grant. is a 59 y.o. male.  HPI     Star Age, MD, PhD [facility] [address]O. [place], [place] [address]  Dear Dr. Ronnald Ramp,   I saw your patient, Matthew Grant, upon your kind request in my sleep clinic today for initial consultation of his sleep disorder, in particular, concern for underlying obstructive sleep apnea.  Patient is accompanied by his wife today.  As you know, Matthew Grant is a 59 year old right-handed gentleman with an underlying medical history of kidney stones, mitral valve prolapse, diverticulosis, reflux disease, knee pain, anxiety, hypertension and obesity, who reports snoring and intermittent palpitations.  He presented recently to the emergency room in December 2020 with worsening palpitations and chest pain as well as some shortness of breath reported.  He is scheduled to see a cardiologist next month.  He denies waking up with a sense of gasping for air but has woken up with palpitations at night.  He denies night to night nocturia or recurrent morning headaches, is not aware of any family history of OSA.  He has benefited from starting Mobic for knee pain, he feels that it has made a big difference, he has also been started on an antidepressant, is currently titrating up on Viibryd.  He reports a bedtime of around 8 PM, rise time around 5 AM.  He denies any telltale symptoms of restless leg syndrome.  He does wake up with a dry mouth at times.  His weight has been stable in the past 2 to 3 years but prior to that he had gradually gained weight, altogether about 20 pounds per wife's report.  He has recently reduced his caffeine intake, currently tries to limit himself to 1 drink per day.  He is a non-smoker and typically does not indulge in any alcohol.  He lives with his wife, he works for [employer]. I reviewed your office note from 02/04/2019.  His Epworth sleepiness score is 7 out of  24, fatigue severity score is 21 out of 63.   His Past Medical History Is Significant For: Past Medical History:  Diagnosis Date  . Anxiety 03/18/2016  . Diverticulosis   . Hypertension   . Kidney calculi   . Mitral valve prolapse     His Past Surgical History Is Significant For: Past Surgical History:  Procedure Laterality Date  . KIDNEY STONE SURGERY  02-2014  . KNEE ARTHROSCOPY  1998   left  . NECK SURGERY  01-2013   herniated disc    His Family History Is Significant For: Family History  Problem Relation Age of Onset  . Cancer Father   . Hypertension Father   . Hyperlipidemia Father   . Heart disease Father   . Cancer Mother   . Hypertension Mother   . Hyperlipidemia Mother   . Colon cancer Maternal Grandfather   . Colon cancer Paternal Grandfather   . Stomach cancer Neg Hx     His Social History Is Significant For: Social History   Socioeconomic History  . Marital status: Married    Spouse name: Not on file  . Number of children: Not on file  . Years of education: Not on file  . Highest education level: Not on file  Occupational History  . Not on file  Tobacco Use  . Smoking status: Never Smoker  . Smokeless tobacco: Never Used  Substance and Sexual Activity  . Alcohol use: No  Alcohol/week: 0.0 standard drinks  . Drug use: No  . Sexual activity: Yes  Other Topics Concern  . Not on file  Social History Narrative  . Not on file   Social Determinants of Health   Financial Resource Strain:   . Difficulty of Paying Living Expenses: Not on file  Food Insecurity:   . Worried About Charity fundraiser in the Last Year: Not on file  . Ran Out of Food in the Last Year: Not on file  Transportation Needs:   . Lack of Transportation (Medical): Not on file  . Lack of Transportation (Non-Medical): Not on file  Physical Activity:   . Days of Exercise per Week: Not on file  . Minutes of Exercise per Session: Not on file  Stress:   . Feeling of Stress :  Not on file  Social Connections:   . Frequency of Communication with Friends and Family: Not on file  . Frequency of Social Gatherings with Friends and Family: Not on file  . Attends Religious Services: Not on file  . Active Member of Clubs or Organizations: Not on file  . Attends Archivist Meetings: Not on file  . Marital Status: Not on file    His Allergies Are:  Allergies  Allergen Reactions  . Iohexol Nausea And Vomiting and Other (See Comments)    SEVERE NAUSEA AND VOMITING WITH IV CONTRAST   . Ivp Dye [Iodinated Diagnostic Agents] Nausea And Vomiting and Other (See Comments)    SEVERE N/V  :   His Current Medications Are:  Outpatient Encounter Medications as of 02/13/2019  Medication Sig  . aspirin EC 81 MG tablet Take 81 mg by mouth daily.  . meloxicam (MOBIC) 7.5 MG tablet Take 1 tablet (7.5 mg total) by mouth daily.  Marland Kitchen telmisartan (MICARDIS) 40 MG tablet Take 1 tablet (40 mg total) by mouth daily.  . Vilazodone HCl (VIIBRYD STARTER PACK) 10 & 20 MG KIT Take 1 tablet by mouth daily.   No facility-administered encounter medications on file as of 02/13/2019.  :  Review of Systems:  Out of a complete 14 point review of systems, all are reviewed and negative with the exception of these symptoms as listed below: Review of Systems  Neurological:       Here for sleep consult. No prior sleep study- mild snoring is present.   Epworth Sleepiness Scale 0= would never doze 1= slight chance of dozing 2= moderate chance of dozing 3= high chance of dozing  Sitting and reading:2 Watching TV:2 Sitting inactive in a public place (ex. Theater or meeting):0 As a passenger in a car for an hour without a break:1 Lying down to rest in the afternoon:2 Sitting and talking to someone:0 Sitting quietly after lunch (no alcohol):0 In a car, while stopped in traffic:0 Total:7     Objective:  Neurological Exam  Physical Exam Physical Examination:   Vitals:   02/13/19  1545  BP: 119/78  Pulse: 61  Temp: (!) 97.4 F (36.3 C)   General Examination: The patient is a very pleasant 59 y.o. male in no acute distress. He appears well-developed and well-nourished and well groomed.   HEENT: Normocephalic, atraumatic, pupils are equal, round and reactive to light, extraocular tracking is good without limitation to gaze excursion or nystagmus noted. Hearing is grossly intact. Face is symmetric with normal facial animation. Speech is clear with no dysarthria noted. There is no hypophonia. There is no lip, neck/head, jaw or voice tremor. Neck  is supple with full range of passive and active motion. There are no carotid bruits on auscultation. Oropharynx exam reveals: mild mouth dryness, adequate dental hygiene and moderate airway crowding, due to Small airway entry and larger appearing uvula, tonsils of about 1+, Mallampati class II.  Tongue protrudes centrally in palate elevates symmetrically.  Neck circumference is 19 inches.  He has a minimal overbite.  Chest: Clear to auscultation without wheezing, rhonchi or crackles noted.  Heart: S1+S2+0, regular and normal without murmurs, rubs or gallops noted.   Abdomen: Soft, non-tender and non-distended with normal bowel sounds appreciated on auscultation.  Extremities: There is no pitting edema in the distal lower extremities bilaterally.   Skin: Warm and dry without trophic changes noted.   Musculoskeletal: exam reveals no obvious joint deformities, tenderness or joint swelling or erythema.   Neurologically:  Mental status: The patient is awake, alert and oriented in all 4 spheres. His immediate and remote memory, attention, language skills and fund of knowledge are appropriate. There is no evidence of aphasia, agnosia, apraxia or anomia. Speech is clear with normal prosody and enunciation. Thought process is linear. Mood is normal and affect is normal.  Cranial nerves II - XII are as described above under HEENT exam.   Motor exam: Normal bulk, strength and tone is noted. There is no tremor, Romberg is negative. Fine motor skills and coordination: grossly intact.  Cerebellar testing: No dysmetria or intention tremor. There is no truncal or gait ataxia.  Sensory exam: intact to light touch in the upper and lower extremities.  Gait, station and balance: He stands easily. No veering to one side is noted. No leaning to one side is noted. Posture is age-appropriate and stance is narrow based. Gait shows normal stride length and normal pace. No problems turning are noted. Tandem walk is unremarkable.                Assessment and Plan:  In summary, Matthew Trulson. is a very pleasant 59 y.o.-year old male with an underlying medical history of kidney stones, mitral valve prolapse, diverticulosis, reflux disease, knee pain, anxiety, hypertension, obesity, Intermittent palpitations and recent chest pain which prompted an emergency room visit, whose history and physical exam are concerning for underlying obstructive sleep apnea (OSA). I had a long chat with the patient and his wife about my findings and the diagnosis of OSA, its prognosis and treatment options. We talked about medical treatments, surgical interventions and non-pharmacological approaches. I explained in particular the risks and ramifications of untreated moderate to severe OSA, especially with respect to developing cardiovascular disease down the Road, including congestive heart failure, difficult to treat hypertension, cardiac arrhythmias, or stroke. Even type 2 diabetes has, in part, been linked to untreated OSA. Symptoms of untreated OSA include daytime sleepiness, memory problems, mood irritability and mood disorder such as depression and anxiety, lack of energy, as well as recurrent headaches, especially morning headaches. We talked about trying to maintain a healthy lifestyle in general, as well as the importance of weight control. We also talked about the  importance of good sleep hygiene. I recommended the following at this time: sleep study.  I explained the sleep test procedure to the patient and also outlined possible surgical and non-surgical treatment options of OSA, including the use of a custom-made dental device (which would require a referral to a specialist dentist or oral surgeon), upper airway surgical options, such as traditional UPPP or a novel less invasive surgical option in the  form of Inspire hypoglossal nerve stimulation (which would involve a referral to an ENT surgeon). I also explained the CPAP treatment option to the patient, who indicated that he would be willing to try CPAP if the need arises. I explained the importance of being compliant with PAP treatment, not only for insurance purposes but primarily to improve His symptoms, and for the patient's long term health benefit, including to reduce His cardiovascular risks. I answered all their questions today and the patient and his wife were in agreement. I plan to see him back after the sleep study is completed and encouraged him to call with any interim questions, concerns, problems or updates.   Thank you very much for allowing me to participate in the care of this nice patient. If I can be of any further assistance to you please do not hesitate to call me at [phone].  Sincerely,   Star Age, MD, PhD

## 2019-02-14 ENCOUNTER — Encounter: Payer: Self-pay | Admitting: Internal Medicine

## 2019-02-15 ENCOUNTER — Other Ambulatory Visit: Payer: Self-pay | Admitting: Internal Medicine

## 2019-02-15 DIAGNOSIS — F320 Major depressive disorder, single episode, mild: Secondary | ICD-10-CM

## 2019-02-15 DIAGNOSIS — I10 Essential (primary) hypertension: Secondary | ICD-10-CM

## 2019-02-15 MED ORDER — VIIBRYD 40 MG PO TABS: 40.0000 mg | ORAL_TABLET | Freq: Every day | ORAL | 1 refills | Status: DC

## 2019-03-04 ENCOUNTER — Ambulatory Visit: Payer: 59 | Admitting: Neurology

## 2019-03-14 ENCOUNTER — Encounter: Payer: Self-pay | Admitting: Cardiology

## 2019-03-14 ENCOUNTER — Telehealth (INDEPENDENT_AMBULATORY_CARE_PROVIDER_SITE_OTHER): Payer: 59 | Admitting: Cardiology

## 2019-03-14 ENCOUNTER — Other Ambulatory Visit: Payer: Self-pay

## 2019-03-14 ENCOUNTER — Encounter: Payer: Self-pay | Admitting: *Deleted

## 2019-03-14 VITALS — Ht 76.0 in | Wt 265.0 lb

## 2019-03-14 DIAGNOSIS — R0789 Other chest pain: Secondary | ICD-10-CM | POA: Diagnosis not present

## 2019-03-14 DIAGNOSIS — R002 Palpitations: Secondary | ICD-10-CM

## 2019-03-14 NOTE — Patient Instructions (Signed)
Medication Instructions:  The current medical regimen is effective;  continue present plan and medications.  *If you need a refill on your cardiac medications before your next appointment, please call your pharmacy*  Testing/Procedures: Your physician has requested that you have an echocardiogram. Echocardiography is a painless test that uses sound waves to create images of your heart. It provides your doctor with information about the size and shape of your heart and how well your heart's chambers and valves are working. This procedure takes approximately one hour. There are no restrictions for this procedure.  You will be contacted by phone to be scheduled for this test which will be completed at our [address] [place], [place] location.  ZIO XT- Long Term Monitor Instructions   Your physician has requested you wear your ZIO patch monitor 14 days.   This is a single patch monitor.  [employer] supplies one patch monitor per enrollment.  Additional stickers are not available.   Please do not apply patch if you will be having a Nuclear Stress Test, Echocardiogram, Cardiac CT, MRI, or Chest Xray during the time frame you would be wearing the monitor. The patch cannot be worn during these tests.  You cannot remove and re-apply the ZIO XT patch monitor.   Your ZIO patch monitor will be sent USPS Priority mail from [facility] directly to your home address. The monitor may also be mailed to a PO BOX if home delivery is not available.   It may take 3-5 days to receive your monitor after you have been enrolled.   Once you have received you monitor, please review enclosed instructions.  Your monitor has already been registered assigning a specific monitor serial # to you.   Applying the monitor   Shave hair from upper left chest.   Hold abrader disc by orange tab.  Rub abrader in 40 strokes over left upper chest as indicated in your monitor instructions.   Clean area with 4  enclosed alcohol pads .  Use all pads to assure are is cleaned thoroughly.  Let dry.   Apply patch as indicated in monitor instructions.  Patch will be place under collarbone on left side of chest with arrow pointing upward.   Rub patch adhesive wings for 2 minutes.Remove white label marked "1".  Remove white label marked "2".  Rub patch adhesive wings for 2 additional minutes.   While looking in a mirror, press and release button in center of patch.  A small green light will flash 3-4 times .  This will be your only indicator the monitor has been turned on.     Do not shower for the first 24 hours.  You may shower after the first 24 hours.   Press button if you feel a symptom. You will hear a small click.  Record Date, Time and Symptom in the Patient Log Book.   When you are ready to remove patch, follow instructions on last 2 pages of Patient Log Book.  Stick patch monitor onto last page of Patient Log Book.   Place Patient Log Book in [place] box.  Use locking tab on box and tape box closed securely.  The [employer] and [employer] has [employer] on it.  Please place in mailbox as soon as possible.  Your physician should have your test results approximately 7 days after the monitor has been mailed back to [facility].   Call [place] at [phone] if you have questions  regarding your ZIO XT patch monitor.  Call them immediately if you see an orange light blinking on your monitor.   If your monitor falls off in less than 4 days contact our Monitor department at [phone].  If your monitor becomes loose or falls off after 4 days call [employer] at [phone] for suggestions on securing your monitor.   Follow-Up: At [facility], you and your health needs are our priority.  As part of our continuing mission to provide you with exceptional heart care, we have created designated Provider Care Teams.  These Care Teams include your primary Cardiologist (physician) and  Advanced Practice Providers (APPs -  Physician Assistants and Nurse Practitioners) who all work together to provide you with the care you need, when you need it.  Your next appointment:   6 months  The format for your next appointment:   In person  Provider:   Dr Candee Furbish  Thank you for choosing [facility]!!     Echocardiogram An echocardiogram is a procedure that uses painless sound waves (ultrasound) to produce an image of the heart. Images from an echocardiogram can provide important information about:  Signs of coronary artery disease (CAD).  Aneurysm detection. An aneurysm is a weak or damaged part of an artery wall that bulges out from the normal force of blood pumping through the body.  Heart size and shape. Changes in the size or shape of the heart can be associated with certain conditions, including heart failure, aneurysm, and CAD.  Heart muscle function.  Heart valve function.  Signs of a past heart attack.  Fluid buildup around the heart.  Thickening of the heart muscle.  A tumor or infectious growth around the heart valves. Tell a health care provider about:  Any allergies you have.  All medicines you are taking, including vitamins, herbs, eye drops, creams, and over-the-counter medicines.  Any blood disorders you have.  Any surgeries you have had.  Any medical conditions you have.  Whether you are pregnant or may be pregnant. What are the risks? Generally, this is a safe procedure. However, problems may occur, including:  Allergic reaction to dye (contrast) that may be used during the procedure. What happens before the procedure? No specific preparation is needed. You may eat and drink normally. What happens during the procedure?   An IV tube may be inserted into one of your veins.  You may receive contrast through this tube. A contrast is an injection that improves the quality of the pictures from your heart.  A gel will be  applied to your chest.  A wand-like tool (transducer) will be moved over your chest. The gel will help to transmit the sound waves from the transducer.  The sound waves will harmlessly bounce off of your heart to allow the heart images to be captured in real-time motion. The images will be recorded on a computer. The procedure may vary among health care providers and hospitals. What happens after the procedure?  You may return to your normal, everyday life, including diet, activities, and medicines, unless your health care provider tells you not to do that. Summary  An echocardiogram is a procedure that uses painless sound waves (ultrasound) to produce an image of the heart.  Images from an echocardiogram can provide important information about the size and shape of your heart, heart muscle function, heart valve function, and fluid buildup around your heart.  You do not need to do anything to prepare before this procedure.  You may eat and drink normally.  After the echocardiogram is completed, you may return to your normal, everyday life, unless your health care provider tells you not to do that. This information is not intended to replace advice given to you by your health care provider. Make sure you discuss any questions you have with your health care provider. Document Revised: 05/03/2018 Document Reviewed: 02/13/2016 Elsevier Patient Education  [place].

## 2019-03-14 NOTE — Progress Notes (Signed)
Virtual Visit via Video Note   This visit type was conducted due to national recommendations for restrictions regarding the COVID-19 Pandemic (e.g. social distancing) in an effort to limit this patient's exposure and mitigate transmission in our community.  Due to his co-morbid illnesses, this patient is at least at moderate risk for complications without adequate follow up.  This format is felt to be most appropriate for this patient at this time.  All issues noted in this document were discussed and addressed.  A limited physical exam was performed with this format.  Please refer to the patient's chart for his consent to telehealth for [facility].   Date:  03/14/2019   ID:  Matthew Durie., DOB [birth year], MRN [id number]  Patient Location: Home Provider Location: Home  PCP:  Matthew Grant  Cardiologist:  No primary care provider on file. Matthew Grant Electrophysiologist:  None   Evaluation Performed:  New Patient Evaluation  Chief Complaint: Evaluation of palpitations  History of Present Illness:    Matthew Bernheim. is a 59 y.o. male here for the evaluation of intermittent palpitations at the request of Matthew Grant.  He was seen in the emergency department on 01/11/2019 with several weeks of palpitations most notably when lying down at night.  They increased in frequency and they can last between 1 to several minutes but then seemed to spontaneously resolve.  Sometimes he will feel associated chest pressure and shortness of breath with these.  At one point he felt as though he was going to have fainting episode.  Denies any fevers chills nausea vomiting syncope bleeding.  In the emergency department labs were drawn and troponins were normal.  Creatinine was normal.  No anemia.  Reviewed showed no evidence of cardiomegaly, normal.  There was low suspicion for pulmonary embolism. He carries a diagnosis of mitral valve prolapse as well.  Father has Alzheimers.  He  went over to his house to observe how he was doing and while he was sleeping at 3 AM had the intense palpitations felt very rapid heart rate his arm felt numb felt a little dizziness and some chest discomfort.  After about 10 minutes of laying down it went away.  He had a [facility] that evening to help keep him awake.  Overall however he has been able to decrease his caffeine and maybe this has helped with his palpitations.    Had cath 20 years ago at [facility], normal. Matthew Grant saw him in past, dx MVP.   Walking well. No CP. Trying to loose weight.   Father had MI at 14 CAD.   States that he enjoys talking basketball with Matthew Grant.    The patient does not have symptoms concerning for COVID-19 infection (fever, chills, cough, or new shortness of breath).    Past Medical History:  Diagnosis Date  . Anxiety 03/18/2016  . Diverticulosis   . Hypertension   . Kidney calculi   . Mitral valve prolapse    Past Surgical History:  Procedure Laterality Date  . KIDNEY STONE SURGERY  02-2014  . KNEE ARTHROSCOPY  1998   left  . NECK SURGERY  01-2013   herniated disc     Current Meds  Medication Sig  . aspirin EC 81 MG tablet Take 81 mg by mouth daily.  . meloxicam (MOBIC) 7.5 MG tablet Take 1 tablet (7.5 mg total) by mouth daily.  Marland Kitchen telmisartan (MICARDIS) 40 MG tablet TAKE 1 TABLET BY  MOUTH DAILY  . Vilazodone HCl (VIIBRYD) 40 MG TABS Take 1 tablet (40 mg total) by mouth daily.     Allergies:   Iohexol and Ivp dye [iodinated diagnostic agents]   Social History   Tobacco Use  . Smoking status: Never Smoker  . Smokeless tobacco: Never Used  Substance Use Topics  . Alcohol use: No    Alcohol/week: 0.0 standard drinks  . Drug use: No     Family Hx: The patient's family history includes Cancer in his father and mother; Colon cancer in his maternal grandfather and paternal grandfather; Heart disease in his father; Hyperlipidemia in his father and mother; Hypertension in his father  and mother. There is no history of Stomach cancer.  ROS:   Please see the history of present illness.    Denies any fever chills nausea vomiting syncope bleeding All other systems reviewed and are negative.   Prior CV studies:   The following studies were reviewed today:  ER visit labs chest x-ray EKG as above  Labs/Other Tests and Data Reviewed:    EKG:  An ECG dated 02/18/2019 was personally reviewed today and demonstrated:  Normal sinus rhythm no other abnormalities  Recent Labs: 01/11/2019: Hemoglobin 15.5; Platelets 266 02/04/2019: BUN 19; Creatinine, Ser 1.07; Potassium 4.3; Sodium 137; TSH 1.27   Recent Lipid Panel Lab Results  Component Value Date/Time   CHOL 185 02/04/2019 09:41 AM   TRIG 134.0 02/04/2019 09:41 AM   HDL 39.30 02/04/2019 09:41 AM   CHOLHDL 5 02/04/2019 09:41 AM   LDLCALC 119 (H) 02/04/2019 09:41 AM    Wt Readings from Last 3 Encounters:  03/14/19 265 lb (120.2 kg)  02/13/19 271 lb (122.9 kg)  02/04/19 270 lb (122.5 kg)     Objective:    Vital Signs:  Ht 6\' 4"  (1.93 m)   Wt 265 lb (120.2 kg)   BMI 32.26 kg/m    VITAL SIGNS:  reviewed GEN:  no acute distress EYES:  sclerae anicteric, EOMI - Extraocular Movements Intact RESPIRATORY:  normal respiratory effort, symmetric expansion SKIN:  no rash, lesions or ulcers. MUSCULOSKELETAL:  no obvious deformities. NEURO:  alert and oriented x 3, no obvious focal deficit PSYCH:  normal affect  ASSESSMENT & PLAN:    Intermittent palpitations -Has had a few episodes where he feels as though he may faint.  I think would be helpful for [place] to check a Zio patch monitor for 14 days.  I have discussed with him the possibility of drinking a [facility] to see if we can potentially trigger some palpitations to duplicate his symptoms.  Differential diagnosis includes atrial fibrillation, PSVT, paroxysmal atrial tachycardia, sinus tachycardia -I will also check an echocardiogram to ensure proper structure and  function.  He does carry a diagnosis of mitral valve prolapse given by Matthew Grant several years ago. -Interesting most of these palpitation episodes occur at around 3 AM during sleep.  He is having a sleep study done next week ordered by Matthew Grant.  Excellent.  His half brother did have atrial fibrillation.  He had similar symptoms.  Chest discomfort -Sometimes will feel some chest discomfort surrounding his palpitations.  If this continues, we will have a low threshold for proceeding with a coronary CT scan with possible FFR analysis.  I will see him back in 6 months for further evaluation.  Of course, we will let him know the results of his monitor and echocardiogram.   COVID-19 Education: The signs and symptoms of COVID-19  were discussed with the patient and how to seek care for testing (follow up with PCP or arrange E-visit).  The importance of social distancing was discussed today.  Time:   Today, I have spent 30 minutes with the patient with telehealth technology discussing the above problems.     Medication Adjustments/Labs and Tests Ordered: Current medicines are reviewed at length with the patient today.  Concerns regarding medicines are outlined above.   Tests Ordered: Orders Placed This Encounter  Procedures  . LONG TERM MONITOR (3-14 DAYS)  . ECHOCARDIOGRAM COMPLETE    Medication Changes: No orders of the defined types were placed in this encounter.   Follow Up:  In Person in 6 month(s)  Signed, Candee Furbish, Grant  03/14/2019 2:49 PM    [place]

## 2019-03-14 NOTE — Progress Notes (Signed)
Patient ID: Matthew Grant., male   DOB: 1960/11/19, 60 y.o.   MRN: [id number] Patient enrolled for [employer] to mail a 14 day ZIO XT long term holter monitor to his home.

## 2019-03-18 ENCOUNTER — Telehealth (HOSPITAL_COMMUNITY): Payer: Self-pay | Admitting: Cardiology

## 2019-03-18 NOTE — Telephone Encounter (Signed)
Called patient and LVM to call office.  Patient needs to schedule an echocardiogram. LBW

## 2019-04-02 ENCOUNTER — Encounter: Payer: Self-pay | Admitting: Internal Medicine

## 2019-04-02 ENCOUNTER — Other Ambulatory Visit: Payer: Self-pay | Admitting: Internal Medicine

## 2019-04-02 DIAGNOSIS — F5104 Psychophysiologic insomnia: Secondary | ICD-10-CM

## 2019-04-02 MED ORDER — DAYVIGO 5 MG PO TABS: 1.0000 | ORAL_TABLET | Freq: Every day | ORAL | 0 refills | Status: DC

## 2019-04-03 ENCOUNTER — Ambulatory Visit (HOSPITAL_COMMUNITY): Payer: 59 | Attending: Cardiovascular Disease

## 2019-04-03 ENCOUNTER — Other Ambulatory Visit: Payer: Self-pay

## 2019-04-03 DIAGNOSIS — R0789 Other chest pain: Secondary | ICD-10-CM

## 2019-04-03 DIAGNOSIS — R002 Palpitations: Secondary | ICD-10-CM | POA: Diagnosis not present

## 2019-04-03 MED ORDER — PERFLUTREN LIPID MICROSPHERE
1.0000 mL | INTRAVENOUS | Status: AC | PRN
  Administered 2019-04-03: 1 mL via INTRAVENOUS

## 2019-04-05 ENCOUNTER — Other Ambulatory Visit: Payer: Self-pay | Admitting: *Deleted

## 2019-04-05 DIAGNOSIS — I77810 Thoracic aortic ectasia: Secondary | ICD-10-CM

## 2019-04-11 ENCOUNTER — Ambulatory Visit (INDEPENDENT_AMBULATORY_CARE_PROVIDER_SITE_OTHER): Payer: 59

## 2019-04-11 DIAGNOSIS — R0789 Other chest pain: Secondary | ICD-10-CM | POA: Diagnosis not present

## 2019-04-11 DIAGNOSIS — R002 Palpitations: Secondary | ICD-10-CM

## 2019-04-26 ENCOUNTER — Other Ambulatory Visit: Payer: Self-pay | Admitting: Internal Medicine

## 2019-04-26 DIAGNOSIS — M170 Bilateral primary osteoarthritis of knee: Secondary | ICD-10-CM

## 2019-05-03 ENCOUNTER — Telehealth: Payer: Self-pay

## 2019-05-03 NOTE — Telephone Encounter (Signed)
Reviewed this phone note with Dr Marlou Porch.  We are unable to locate this information in the chart and have asked Markus Daft to have it uploaded and sent to Dr Marlou Porch for review.

## 2019-05-03 NOTE — Telephone Encounter (Signed)
Phone call from [place] at [facility] to report Zio monitor event.  Brittney states pt report is now available for review.  Pt wore monitor for 12 days and had 17 episodes of SVT.  Strip that is of concern is Strip 5.  Pt had a HR of 209 for greater than 1 minute.  Will forward information to Dr Marlou Porch and RN for review.

## 2019-05-06 ENCOUNTER — Telehealth: Payer: Self-pay

## 2019-05-06 NOTE — Telephone Encounter (Signed)
lpmtcb 4/12 

## 2019-05-06 NOTE — Telephone Encounter (Signed)
-----   Message from Jerline Pain, MD sent at 05/06/2019  2:48 PM EDT ----- Occasional episodes of symptomatic supraventricular tachycardia.  Longest episode was approximately 30 minutes duration.  No atrial fibrillation.  Please refer him to EP, electrophysiology, to discuss further treatment strategies. Candee Furbish, MD

## 2019-05-07 ENCOUNTER — Telehealth: Payer: Self-pay

## 2019-05-07 ENCOUNTER — Other Ambulatory Visit: Payer: Self-pay

## 2019-05-07 DIAGNOSIS — I471 Supraventricular tachycardia: Secondary | ICD-10-CM

## 2019-05-07 NOTE — Telephone Encounter (Signed)
Pt called back returning Michael's call from yesterday. Please call pt back after 2:00 pm.

## 2019-05-07 NOTE — Telephone Encounter (Signed)
LMTCB

## 2019-05-07 NOTE — Telephone Encounter (Signed)
The patient has been notified of the Monitor result and verbalized understanding.  All questions (if any) were answered. Frederik Schmidt, RN 05/07/2019 2:07 PM    Referred to EP per Dr Marlou Porch

## 2019-05-07 NOTE — Telephone Encounter (Signed)
New message ° ° °Patient is returning your call. Please call. °

## 2019-05-08 NOTE — Telephone Encounter (Signed)
Frederik Schmidt, RN  05/07/2019 2:06 PM EDT    The patient has been notified of the result and verbalized understanding. All questions (if any) were answered. Frederik Schmidt, RN 05/07/2019 2:06 PM

## 2019-05-21 ENCOUNTER — Ambulatory Visit: Payer: 59 | Admitting: Internal Medicine

## 2019-05-21 ENCOUNTER — Other Ambulatory Visit: Payer: Self-pay

## 2019-05-21 ENCOUNTER — Encounter: Payer: Self-pay | Admitting: Internal Medicine

## 2019-05-21 VITALS — BP 142/90 | HR 72 | Ht 76.0 in | Wt 272.0 lb

## 2019-05-21 DIAGNOSIS — I471 Supraventricular tachycardia: Secondary | ICD-10-CM

## 2019-05-21 DIAGNOSIS — R002 Palpitations: Secondary | ICD-10-CM | POA: Diagnosis not present

## 2019-05-21 MED ORDER — METOPROLOL SUCCINATE ER 50 MG PO TB24: ORAL_TABLET | ORAL | 3 refills | Status: DC

## 2019-05-21 NOTE — Progress Notes (Signed)
HPI Mr. Matthew Grant is referred today by Dr. Marlou Porch for evaluation of SVT. He is a pleasant 59 yo man with HTN who has had palpitations and SVT for over a year. In the last few months his symptoms have increased in frequency and severity. He wore a cardiac monitor which demonstrated SVT at 180/min. These start and stop suddenly and look like a short RP tachycardia. He admits to consuming caffeine in excess. He is on the road travelling and likes to drink sweatened caffeinated soft drinks.  Allergies  Allergen Reactions  . Iohexol Nausea And Vomiting and Other (See Comments)    SEVERE NAUSEA AND VOMITING WITH IV CONTRAST   . Ivp Dye [Iodinated Diagnostic Agents] Nausea And Vomiting and Other (See Comments)    SEVERE N/V     Current Outpatient Medications  Medication Sig Dispense Refill  . aspirin EC 81 MG tablet Take 81 mg by mouth daily.    . Lemborexant (DAYVIGO) 5 MG TABS Take 1 tablet by mouth at bedtime. 10 tablet 0  . meloxicam (MOBIC) 7.5 MG tablet TAKE 1 TABLET(7.5 MG) BY MOUTH DAILY 90 tablet 0  . telmisartan (MICARDIS) 40 MG tablet TAKE 1 TABLET BY MOUTH DAILY 90 tablet 1  . Vilazodone HCl (VIIBRYD) 40 MG TABS Take 1 tablet (40 mg total) by mouth daily. 90 tablet 1   No current facility-administered medications for this visit.     Past Medical History:  Diagnosis Date  . Anxiety 03/18/2016  . Diverticulosis   . Hypertension   . Kidney calculi   . Mitral valve prolapse     ROS:   All systems reviewed and negative except as noted in the HPI.   Past Surgical History:  Procedure Laterality Date  . KIDNEY STONE SURGERY  02-2014  . KNEE ARTHROSCOPY  1998   left  . NECK SURGERY  01-2013   herniated disc     Family History  Problem Relation Age of Onset  . Cancer Father   . Hypertension Father   . Hyperlipidemia Father   . Heart disease Father   . Cancer Mother   . Hypertension Mother   . Hyperlipidemia Mother   . Colon cancer Maternal Grandfather   .  Colon cancer Paternal Grandfather   . Stomach cancer Neg Hx      Social History   Socioeconomic History  . Marital status: Married    Spouse name: Not on file  . Number of children: Not on file  . Years of education: Not on file  . Highest education level: Not on file  Occupational History  . Not on file  Tobacco Use  . Smoking status: Never Smoker  . Smokeless tobacco: Never Used  Substance and Sexual Activity  . Alcohol use: No    Alcohol/week: 0.0 standard drinks  . Drug use: No  . Sexual activity: Yes  Other Topics Concern  . Not on file  Social History Narrative  . Not on file   Social Determinants of Health   Financial Resource Strain:   . Difficulty of Paying Living Expenses:   Food Insecurity:   . Worried About Charity fundraiser in the Last Year:   . Arboriculturist in the Last Year:   Transportation Needs:   . Film/video editor (Medical):   Marland Kitchen Lack of Transportation (Non-Medical):   Physical Activity:   . Days of Exercise per Week:   . Minutes of Exercise per Session:  Stress:   . Feeling of Stress :   Social Connections:   . Frequency of Communication with Friends and Family:   . Frequency of Social Gatherings with Friends and Family:   . Attends Religious Services:   . Active Member of Clubs or Organizations:   . Attends Archivist Meetings:   Marland Kitchen Marital Status:   Intimate Partner Violence:   . Fear of Current or Ex-Partner:   . Emotionally Abused:   Marland Kitchen Physically Abused:   . Sexually Abused:      BP (!) 142/90   Pulse 72   Ht 6\' 4"  (1.93 m)   Wt 272 lb (123.4 kg)   SpO2 96%   BMI 33.11 kg/m   Physical Exam:  Well appearing NAD HEENT: Unremarkable Neck:  No JVD, no thyromegally Lymphatics:  No adenopathy Back:  No CVA tenderness Lungs:  Clear with no wheezes HEART:  Regular rate rhythm, no murmurs, no rubs, no clicks Abd:  soft, positive bowel sounds, no organomegally, no rebound, no guarding Ext:  2 plus pulses, no  edema, no cyanosis, no clubbing Skin:  No rashes no nodules Neuro:  CN II through XII intact, motor grossly intact  EKG -  Reviewed - NSR with no pre-excitation  Assess/Plan: 1. SVT - I have discussed the treatment options with the patient and his wife. The risks/benefits/goals/expectations of catheter ablation as well as medical therapy were reviewed. He would like to try a beta blocker first and if intolerant or ineffective then catheter ablation would be recommended. 2. HTN - his bp is up today. He is on micardis. Hopefully the toprol will help to control his pressure as well.  Mikle Bosworth.D.

## 2019-05-21 NOTE — Patient Instructions (Addendum)
Medication Instructions:  Your physician recommends that you continue on your current medications as directed. Please refer to the Current Medication list given to you today.  1.  Start taking Toprol XL 50 mg--Take 1/2 tablet by mouth every night for 7 days. After 7 days increase to a whole tablet by mouth every night.  Labwork: None ordered.  Testing/Procedures: None ordered.  Follow-Up: Your physician wants you to follow-up in: 6 weeks with Dr. Lovena Le.     July 10, 2019 at 9:45 am  Any Other Special Instructions Will Be Listed Below (If Applicable).  If you need a refill on your cardiac medications before your next appointment, please call your pharmacy.    Metoprolol Extended-Release Capsules What is this medicine? METOPROLOL (me TOE proe lole) is a beta blocker. It decreases the amount of work your heart has to do and helps your heart beat regularly. It treats high blood pressure and/or prevents chest pain (also called angina). It also treats heart failure. This medicine may be used for other purposes; ask your health care provider or pharmacist if you have questions. COMMON BRAND NAME(S): KAPSPARGO What should I tell my health care provider before I take this medicine? They need to know if you have any of these conditions:  diabetes  heart disease  liver disease  lung or breathing disease, like asthma  pheochromocytoma  thyroid disease  an unusual or allergic reaction to metoprolol, other beta-blockers, medicines, foods, dyes, or preservatives  pregnant or trying to get pregnant  breast-feeding How should I use this medicine? Take this drug by mouth with water. Take it as directed on the prescription label at the same time every day. Do not cut, crush or chew this drug. Swallow the capsules whole. You may open the capsule and put the contents in 1 teaspoon of applesauce. Swallow the drug and applesauce right away. Do not chew the drug or applesauce. Keep taking it  unless your health care provider tells you to stop. Talk to your health care provider about the use of this drug in children. While it may be prescribed for children as young as 6 for selected conditions, precautions do apply. Overdosage: If you think you have taken too much of this medicine contact a poison control center or emergency room at once. NOTE: This medicine is only for you. Do not share this medicine with others. What if I miss a dose? If you miss a dose, take it as soon as you can. If it is almost time for your next dose, take only that dose. Do not take double or extra doses. What may interact with this medicine? This medicine may interact with the following medications:  certain medicines for blood pressure, heart disease, irregular heart beat  epinephrine  fluoxetine  MAOIs like Carbex, Eldepryl, Marplan, Nardil, and Parnate  paroxetine  reserpine This list may not describe all possible interactions. Give your health care provider a list of all the medicines, herbs, non-prescription drugs, or dietary supplements you use. Also tell them if you smoke, drink alcohol, or use illegal drugs. Some items may interact with your medicine. What should I watch for while using this medicine? You may get drowsy or dizzy. Do not drive, use machinery, or do anything that needs mental alertness until you know how this medicine affects you. Do not stand or sit up quickly, especially if you are an older patient. This reduces the risk of dizzy or fainting spells. Alcohol may interfere with the effect of this  medicine. Avoid alcoholic drinks. Visit your doctor or health care professional for regular checks on your progress. Check your blood pressure as directed. Ask your doctor or health care professional what your blood pressure should be and when you should contact him or her. Do not treat yourself for coughs, colds, or pain while you are using this medicine without asking your doctor or health  care professional for advice. Some ingredients may increase your blood pressure. This medicine may increase blood sugar. Ask your healthcare provider if changes in diet or medicines are needed if you have diabetes. What side effects may I notice from receiving this medicine? Side effects that you should report to your doctor or health care professional as soon as possible:  allergic reactions like skin rash, itching or hives, swelling of the face, lips, or tongue  cold hands or feet  signs and symptoms of high blood sugar such as being more thirsty or hungry or having to urinate more than normal. You may also feel very tired or have blurry vision.  signs and symptoms of low blood pressure like dizziness; feeling faint or lightheaded, falls; unusually weak or tired  signs of worsening heart failure like breathing problems, swelling in your legs and feet  suicidal thoughts or other mood changes  unusually slow heartbeat Side effects that usually do not require medical attention (report these to your doctor or health care professional if they continue or are bothersome):  anxious  change in sex drive or performance  diarrhea  headache  trouble sleeping  upset stomach This list may not describe all possible side effects. Call your doctor for medical advice about side effects. You may report side effects to FDA at 1-800-FDA-1088. Where should I keep my medicine? Keep out of the reach of children and pets. Store at room temperature between 20 and 25 degrees C (68 and 77 degrees F). Throw away any unused drug after the expiration date. NOTE: This sheet is a summary. It may not cover all possible information. If you have questions about this medicine, talk to your doctor, pharmacist, or health care provider.  2020 Elsevier/Gold Standard (2018-10-10 13:24:03)

## 2019-07-10 ENCOUNTER — Ambulatory Visit: Payer: 59 | Admitting: Internal Medicine

## 2019-07-10 ENCOUNTER — Other Ambulatory Visit: Payer: Self-pay

## 2019-07-10 VITALS — BP 124/82 | HR 58 | Ht 76.0 in | Wt 271.0 lb

## 2019-07-10 DIAGNOSIS — I10 Essential (primary) hypertension: Secondary | ICD-10-CM

## 2019-07-10 DIAGNOSIS — I471 Supraventricular tachycardia: Secondary | ICD-10-CM

## 2019-07-10 MED ORDER — METOPROLOL SUCCINATE ER 50 MG PO TB24: 50.0000 mg | ORAL_TABLET | Freq: Every day | ORAL | 3 refills | Status: DC

## 2019-07-10 MED ORDER — METOPROLOL SUCCINATE ER 50 MG PO TB24: 50.0000 mg | ORAL_TABLET | Freq: Every day | ORAL | 7 refills | Status: DC

## 2019-07-10 NOTE — Patient Instructions (Addendum)
Medication Instructions:  Your physician recommends that you continue on your current medications as directed. Please refer to the Current Medication list given to you today.  Labwork: None ordered.  Testing/Procedures: None ordered.  Follow-Up: Your physician wants you to follow-up in: 2 years with Dr. Taylor.   You will receive a reminder letter in the mail two months in advance. If you don't receive a letter, please call our office to schedule the follow-up appointment.   Any Other Special Instructions Will Be Listed Below (If Applicable).  If you need a refill on your cardiac medications before your next appointment, please call your pharmacy.   

## 2019-07-10 NOTE — Progress Notes (Signed)
HPI Matthew Grant returns today for followup. He is a pleasant 59 yo man with SVT who we saw several months ago and started him on beta blocker therapy. He has done well in the interim with no chest pain, sob, or palpitations on the beta blocker. He feels well. No syncope. No side effects from the metoprolol. Allergies  Allergen Reactions   Iohexol Nausea And Vomiting and Other (See Comments)    SEVERE NAUSEA AND VOMITING WITH IV CONTRAST    Ivp Dye [Iodinated Diagnostic Agents] Nausea And Vomiting and Other (See Comments)    SEVERE N/V     Current Outpatient Medications  Medication Sig Dispense Refill   aspirin EC 81 MG tablet Take 81 mg by mouth daily.     Lemborexant (DAYVIGO) 5 MG TABS Take 1 tablet by mouth at bedtime. 10 tablet 0   meloxicam (MOBIC) 7.5 MG tablet TAKE 1 TABLET(7.5 MG) BY MOUTH DAILY 90 tablet 0   metoprolol succinate (TOPROL-XL) 50 MG 24 hr tablet Take 1 tablet (50 mg total) by mouth daily. 90 tablet 3   telmisartan (MICARDIS) 40 MG tablet TAKE 1 TABLET BY MOUTH DAILY 90 tablet 1   Vilazodone HCl (VIIBRYD) 40 MG TABS Take 1 tablet (40 mg total) by mouth daily. 90 tablet 1   No current facility-administered medications for this visit.     Past Medical History:  Diagnosis Date   Anxiety 03/18/2016   Diverticulosis    Hypertension    Kidney calculi    Mitral valve prolapse     ROS:   All systems reviewed and negative except as noted in the HPI.   Past Surgical History:  Procedure Laterality Date   KIDNEY STONE SURGERY  02-2014   KNEE ARTHROSCOPY  1998   left   NECK SURGERY  01-2013   herniated disc     Family History  Problem Relation Age of Onset   Cancer Father    Hypertension Father    Hyperlipidemia Father    Heart disease Father    Cancer Mother    Hypertension Mother    Hyperlipidemia Mother    Colon cancer Maternal Grandfather    Colon cancer Paternal Grandfather    Stomach cancer Neg Hx       Social History   Socioeconomic History   Marital status: Married    Spouse name: Not on file   Number of children: Not on file   Years of education: Not on file   Highest education level: Not on file  Occupational History   Not on file  Tobacco Use   Smoking status: Never Smoker   Smokeless tobacco: Never Used  Substance and Sexual Activity   Alcohol use: No    Alcohol/week: 0.0 standard drinks   Drug use: No   Sexual activity: Yes  Other Topics Concern   Not on file  Social History Narrative   Not on file   Social Determinants of Health   Financial Resource Strain:    Difficulty of Paying Living Expenses:   Food Insecurity:    Worried About Charity fundraiser in the Last Year:    Arboriculturist in the Last Year:   Transportation Needs:    Film/video editor (Medical):    Lack of Transportation (Non-Medical):   Physical Activity:    Days of Exercise per Week:    Minutes of Exercise per Session:   Stress:    Feeling of Stress :  Social Connections:    Frequency of Communication with Friends and Family:    Frequency of Social Gatherings with Friends and Family:    Attends Religious Services:    Active Member of Clubs or Organizations:    Attends Music therapist:    Marital Status:   Intimate Partner Violence:    Fear of Current or Ex-Partner:    Emotionally Abused:    Physically Abused:    Sexually Abused:      BP 124/82    Pulse (!) 58    Ht 6\' 4"  (1.93 m)    Wt 271 lb (122.9 kg)    SpO2 94%    BMI 32.99 kg/m   Physical Exam:  Well appearing NAD HEENT: Unremarkable Neck:  No JVD, no thyromegally Lymphatics:  No adenopathy Back:  No CVA tenderness Lungs:  Clear with no wheezes HEART:  Regular rate rhythm, no murmurs, no rubs, no clicks Abd:  soft, positive bowel sounds, no organomegally, no rebound, no guarding Ext:  2 plus pulses, no edema, no cyanosis, no clubbing Skin:  No rashes no  nodules Neuro:  CN II through XII intact, motor grossly intact  EKG - none  Assess/Plan: 1. SVT - he appears to be tolerating his metoprolol and has had no additional episodes of SVT. He will continue his beta blocker and followup with [place] in 2 years unless he starts to have SVT break throughs. 2. HTN - his bp is well controlled.   Matthew Grant.D.

## 2019-07-25 ENCOUNTER — Other Ambulatory Visit: Payer: Self-pay | Admitting: Internal Medicine

## 2019-07-25 DIAGNOSIS — M170 Bilateral primary osteoarthritis of knee: Secondary | ICD-10-CM

## 2019-08-29 ENCOUNTER — Other Ambulatory Visit: Payer: Self-pay | Admitting: Internal Medicine

## 2019-08-29 DIAGNOSIS — I10 Essential (primary) hypertension: Secondary | ICD-10-CM

## 2019-10-07 ENCOUNTER — Encounter: Payer: Self-pay | Admitting: Internal Medicine

## 2019-10-08 NOTE — Telephone Encounter (Addendum)
Pt has been referred to the [facility].

## 2019-10-09 ENCOUNTER — Other Ambulatory Visit: Payer: Self-pay

## 2019-10-09 ENCOUNTER — Ambulatory Visit (INDEPENDENT_AMBULATORY_CARE_PROVIDER_SITE_OTHER): Payer: 59 | Admitting: Nurse Practitioner

## 2019-10-09 VITALS — BP 124/72 | HR 59 | Temp 97.3°F | Ht 76.0 in | Wt 265.0 lb

## 2019-10-09 DIAGNOSIS — Z20822 Contact with and (suspected) exposure to covid-19: Secondary | ICD-10-CM

## 2019-10-09 NOTE — Progress Notes (Signed)
@Patient  ID: Matthew Grant., male    DOB: March 26, 1960, 58 y.o.   MRN: [id number]  Chief Complaint  Patient presents with  . Covid Exposure    Started sx Thursday Sx: Body aches, fatigue, headaches, runny nose, cough    Referring provider: Janith Lima, MD   59 year old male with history of mitral valve prolapse, hypertension, SVT, sleep apnea, allergic rhinitis, GERD, anxiety.  HPI  Patient presents today for respiratory clinic visit.  He was referred here by [facility] primary care for Covid-like symptoms.  Patient states that he has been having body aches, fatigue, headaches, runny nose, cough, and diarrhea for the last several days.  Vital signs are stable.  Will check a Covid test. Denies f/c/s, n/v/d, hemoptysis, PND, chest pain or edema.      Allergies  Allergen Reactions  . Iohexol Nausea And Vomiting and Other (See Comments)    SEVERE NAUSEA AND VOMITING WITH IV CONTRAST   . Ivp Dye [Iodinated Diagnostic Agents] Nausea And Vomiting and Other (See Comments)    SEVERE N/V    Immunization History  Administered Date(s) Administered  . Influenza Split 10/31/2011  . Influenza,inj,Quad PF,6+ Mos 01/30/2017, 11/20/2017, 11/13/2018  . Tdap 05/22/2017    Past Medical History:  Diagnosis Date  . Anxiety 03/18/2016  . Diverticulosis   . Hypertension   . Kidney calculi   . Mitral valve prolapse     Tobacco History: Social History   Tobacco Use  Smoking Status Never Smoker  Smokeless Tobacco Never Used   Counseling given: Yes   Outpatient Encounter Medications as of 10/09/2019  Medication Sig  . telmisartan (MICARDIS) 40 MG tablet TAKE 1 TABLET BY MOUTH DAILY  . Vilazodone HCl (VIIBRYD) 40 MG TABS Take 1 tablet (40 mg total) by mouth daily.  Marland Kitchen aspirin EC 81 MG tablet Take 81 mg by mouth daily. (Patient not taking: Reported on 10/09/2019)  . Lemborexant (DAYVIGO) 5 MG TABS Take 1 tablet by mouth at bedtime. (Patient not taking: Reported on 10/09/2019)  .  meloxicam (MOBIC) 7.5 MG tablet TAKE 1 TABLET(7.5 MG) BY MOUTH DAILY (Patient not taking: Reported on 10/09/2019)  . metoprolol succinate (TOPROL-XL) 50 MG 24 hr tablet Take 1 tablet (50 mg total) by mouth daily.  . [DISCONTINUED] meloxicam (MOBIC) 7.5 MG tablet Take 1 tablet (7.5 mg total) by mouth daily.   No facility-administered encounter medications on file as of 10/09/2019.     Review of Systems  Review of Systems  Constitutional: Positive for chills and fatigue. Negative for fever.  HENT: Positive for congestion and postnasal drip.   Respiratory: Positive for cough. Negative for shortness of breath.   Cardiovascular: Negative.   Gastrointestinal: Positive for diarrhea.  Musculoskeletal: Positive for myalgias.  Allergic/Immunologic: Negative.   Neurological: Positive for headaches.  Psychiatric/Behavioral: Negative.        Physical Exam  BP 124/72 (BP Location: Right Arm)   Pulse (!) 59   Temp (!) 97.3 F (36.3 C)   Ht 6\' 4"  (1.93 m)   Wt 265 lb (120.2 kg)   SpO2 95%   BMI 32.26 kg/m   Wt Readings from Last 5 Encounters:  10/09/19 265 lb (120.2 kg)  07/10/19 271 lb (122.9 kg)  05/21/19 272 lb (123.4 kg)  03/14/19 265 lb (120.2 kg)  02/13/19 271 lb (122.9 kg)     Physical Exam Vitals and nursing note reviewed.  Constitutional:      General: He is not in acute distress.  Appearance: He is well-developed.  Cardiovascular:     Rate and Rhythm: Normal rate and regular rhythm.  Pulmonary:     Effort: Pulmonary effort is normal.     Breath sounds: Normal breath sounds.  Musculoskeletal:     Right lower leg: No edema.     Left lower leg: No edema.  Skin:    General: Skin is warm and dry.  Neurological:     Mental Status: He is alert and oriented to person, place, and time.  Psychiatric:        Mood and Affect: Mood normal.        Behavior: Behavior normal.        Assessment & Plan:   Exposure to COVID-19 virus Stay well hydrated  Stay  active  Deep breathing exercises  May start vitamin C 2,000 mg daily, vitamin D3 2,000 IU daily, Zinc 220 mg daily, and Quercetin 500 mg twice daily  May take tylenol or fever or pain  May take mucinex DM twice daily if needed   Follow up:   if needed      Fenton Foy, NP 10/10/2019

## 2019-10-09 NOTE — Patient Instructions (Addendum)
Covid 19:   Stay well hydrated  Stay active  Deep breathing exercises  May start vitamin C 2,000 mg daily, vitamin D3 2,000 IU daily, Zinc 220 mg daily, and Quercetin 500 mg twice daily  May take tylenol or fever or pain  May take mucinex DM twice daily if needed   Follow up:   if needed       Person Under Monitoring Name: Matthew Grant.  Location: [place] [address]   Infection Prevention Recommendations for Individuals Confirmed to have, or Being Evaluated for, 2019 Novel Coronavirus (COVID-19) Infection Who Receive Care at Home  Individuals who are confirmed to have, or are being evaluated for, COVID-19 should follow the prevention steps below until a healthcare provider or local or state health department says they can return to normal activities.  Stay home except to get medical care You should restrict activities outside your home, except for getting medical care. Do not go to work, school, or public areas, and do not use public transportation or taxis.  Call ahead before visiting your doctor Before your medical appointment, call the healthcare provider and tell them that you have, or are being evaluated for, COVID-19 infection. This will help the healthcare provider's office take steps to keep other people from getting infected. Ask your healthcare provider to call the local or state health department.  Monitor your symptoms Seek prompt medical attention if your illness is worsening (e.g., difficulty breathing). Before going to your medical appointment, call the healthcare provider and tell them that you have, or are being evaluated for, COVID-19 infection. Ask your healthcare provider to call the local or state health department.  Wear a facemask You should wear a facemask that covers your nose and mouth when you are in the same room with other people and when you visit a healthcare provider. People who live with or  visit you should also wear a facemask while they are in the same room with you.  Separate yourself from other people in your home As much as possible, you should stay in a different room from other people in your home. Also, you should use a separate bathroom, if available.  Avoid sharing household items You should not share dishes, drinking glasses, cups, eating utensils, towels, bedding, or other items with other people in your home. After using these items, you should wash them thoroughly with soap and water.  Cover your coughs and sneezes Cover your mouth and nose with a tissue when you cough or sneeze, or you can cough or sneeze into your sleeve. Throw used tissues in a lined trash can, and immediately wash your hands with soap and water for at least 20 seconds or use an alcohol-based hand rub.  Wash your [facility] your hands often and thoroughly with soap and water for at least 20 seconds. You can use an alcohol-based hand sanitizer if soap and water are not available and if your hands are not visibly dirty. Avoid touching your eyes, nose, and mouth with unwashed hands.   Prevention Steps for Caregivers and Household Members of Individuals Confirmed to have, or Being Evaluated for, COVID-19 Infection Being Cared for in the Home  If you live with, or provide care at home for, a person confirmed to have, or being evaluated for, COVID-19 infection please follow these guidelines to prevent infection:  Follow healthcare provider's instructions Make sure that you understand and can help the patient follow any healthcare provider  instructions for all care.  Provide for the patient's basic needs You should help the patient with basic needs in the home and provide support for getting groceries, prescriptions, and other personal needs.  Monitor the patient's symptoms If they are getting sicker, call his or her medical provider and tell them that the patient has, or is being evaluated  for, COVID-19 infection. This will help the healthcare provider's office take steps to keep other people from getting infected. Ask the healthcare provider to call the local or state health department.  Limit the number of people who have contact with the patient  If possible, have only one caregiver for the patient.  Other household members should stay in another home or place of residence. If this is not possible, they should stay  in another room, or be separated from the patient as much as possible. Use a separate bathroom, if available.  Restrict visitors who do not have an essential need to be in the home.  Keep older adults, very young children, and other sick people away from the patient Keep older adults, very young children, and those who have compromised immune systems or chronic health conditions away from the patient. This includes people with chronic heart, lung, or kidney conditions, diabetes, and cancer.  Ensure good ventilation Make sure that shared spaces in the home have good air flow, such as from an air conditioner or an opened window, weather permitting.  Wash your hands often  Wash your hands often and thoroughly with soap and water for at least 20 seconds. You can use an alcohol based hand sanitizer if soap and water are not available and if your hands are not visibly dirty.  Avoid touching your eyes, nose, and mouth with unwashed hands.  Use disposable paper towels to dry your hands. If not available, use dedicated cloth towels and replace them when they become wet.  Wear a facemask and gloves  Wear a disposable facemask at all times in the room and gloves when you touch or have contact with the patient's blood, body fluids, and/or secretions or excretions, such as sweat, saliva, sputum, nasal mucus, vomit, urine, or feces.  Ensure the mask fits over your nose and mouth tightly, and do not touch it during use.  Throw out disposable facemasks and gloves after  using them. Do not reuse.  Wash your hands immediately after removing your facemask and gloves.  If your personal clothing becomes contaminated, carefully remove clothing and launder. Wash your hands after handling contaminated clothing.  Place all used disposable facemasks, gloves, and other waste in a lined container before disposing them with other household waste.  Remove gloves and wash your hands immediately after handling these items.  Do not share dishes, glasses, or other household items with the patient  Avoid sharing household items. You should not share dishes, drinking glasses, cups, eating utensils, towels, bedding, or other items with a patient who is confirmed to have, or being evaluated for, COVID-19 infection.  After the person uses these items, you should wash them thoroughly with soap and water.  Wash laundry thoroughly  Immediately remove and wash clothes or bedding that have blood, body fluids, and/or secretions or excretions, such as sweat, saliva, sputum, nasal mucus, vomit, urine, or feces, on them.  Wear gloves when handling laundry from the patient.  Read and follow directions on labels of laundry or clothing items and detergent. In general, wash and dry with the warmest temperatures recommended on the label.  Clean all areas the individual has used often  Clean all touchable surfaces, such as counters, tabletops, doorknobs, bathroom fixtures, toilets, phones, keyboards, tablets, and bedside tables, every day. Also, clean any surfaces that may have blood, body fluids, and/or secretions or excretions on them.  Wear gloves when cleaning surfaces the patient has come in contact with.  Use a diluted bleach solution (e.g., dilute bleach with 1 part bleach and 10 parts water) or a household disinfectant with a label that says EPA-registered for coronaviruses. To make a bleach solution at home, add 1 tablespoon of bleach to 1 quart (4 cups) of water. For a larger  supply, add  cup of bleach to 1 gallon (16 cups) of water.  Read labels of cleaning products and follow recommendations provided on product labels. Labels contain instructions for safe and effective use of the cleaning product including precautions you should take when applying the product, such as wearing gloves or eye protection and making sure you have good ventilation during use of the product.  Remove gloves and wash hands immediately after cleaning.  Monitor yourself for signs and symptoms of illness Caregivers and household members are considered close contacts, should monitor their health, and will be asked to limit movement outside of the home to the extent possible. Follow the monitoring steps for close contacts listed on the symptom monitoring form.   ? If you have additional questions, contact your local health department or call the epidemiologist on call at [phone] (available 24/7). ? This guidance is subject to change. For the most up-to-date guidance from [facility], please refer to their website: YouBlogs.pl

## 2019-10-10 DIAGNOSIS — Z20822 Contact with and (suspected) exposure to covid-19: Secondary | ICD-10-CM | POA: Insufficient documentation

## 2019-10-10 LAB — SARS-COV-2, NAA 2 DAY TAT

## 2019-10-10 LAB — NOVEL CORONAVIRUS, NAA: SARS-CoV-2, NAA: NOT DETECTED

## 2019-10-10 NOTE — Assessment & Plan Note (Signed)
Stay well hydrated  Stay active  Deep breathing exercises  May start vitamin C 2,000 mg daily, vitamin D3 2,000 IU daily, Zinc 220 mg daily, and Quercetin 500 mg twice daily  May take tylenol or fever or pain  May take mucinex DM twice daily if needed   Follow up:   if needed

## 2019-10-14 ENCOUNTER — Telehealth: Payer: Self-pay | Admitting: *Deleted

## 2019-10-14 NOTE — Progress Notes (Signed)
Telehealth Visit     Virtual Visit via Telephone Note   This visit type was conducted due to national recommendations for restrictions regarding the COVID-19 Pandemic (e.g. social distancing) in an effort to limit this patient's exposure and mitigate transmission in our community.  Due to his co-morbid illnesses, this patient is at least at moderate risk for complications without adequate follow up.  This format is felt to be most appropriate for this patient at this time.  The patient did not have access to video technology/had technical difficulties with video requiring transitioning to audio format only (telephone).  All issues noted in this document were discussed and addressed.  No physical exam could be performed with this format.  Please refer to the patient's chart for his  consent to telehealth for [facility].   Evaluation Performed:  Follow-up visit   The patient was identified using 2 identifiers.   This visit type was conducted due to national recommendations for restrictions regarding the COVID-19 Pandemic (e.g. social distancing).  This format is felt to be most appropriate for this patient at this time.  All issues noted in this document were discussed and addressed.  No physical exam was performed (except for noted visual exam findings with Video Visits).  Please refer to the patient's chart (MyChart message for video visits and phone note for telephone visits) for the patient's consent to telehealth for [facility].  Date:  10/21/2019   ID:  Matthew Grant., DOB 08-15-1960, MRN [id number]  Patient Location:  Home  Provider location:   Home  PCP:  Janith Lima, MD  Cardiologist:  Marisa Cyphers Electrophysiologist:  Lovena Le  Chief Complaint:  Follow up visit  History of Present Illness:    Matthew Mayhall. is a 59 y.o. male who presents via audio/video conferencing for a telehealth visit today.  Seen for Dr. Lovena Le and Dr. Marlou Porch.   He has a  history of SVT and is managed with beta blocker therapy. He has had remote cath per Dr. Doreatha Lew which was normal (over 20 years ago). History of MVP, HTN and anxiety. Dilated ascending aorta noted on echo from March of 2021.   Had a telehealth visit with Dr. Marlou Porch last February. Monitor was done in April showing SVT - referred to EP. Last seen here in the office in June by Dr. Lovena Le and felt to be doing ok on beta blocker therapy.  The patient does not have symptoms concerning for COVID-19 infection (fever, chills, cough, or new shortness of breath).   Seen today by telephone call. He has consented for this visit. No vitals obtained. Says he is doing fine. Does not need medicines refilled. Feels good. He went to the antique festival down in [place] this past weekend. No problems noted - no palpitations whatsoever. He does get tired in the evening - but works long hours and has a stressful job - so he attributes his fatigue to this and not to his medicines. He is happy with how he is doing.    Past Medical History:  Diagnosis Date  . Anxiety 03/18/2016  . Diverticulosis   . Hypertension   . Kidney calculi   . Mitral valve prolapse    Past Surgical History:  Procedure Laterality Date  . KIDNEY STONE SURGERY  02-2014  . KNEE ARTHROSCOPY  1998   left  . NECK SURGERY  01-2013   herniated disc     Current Meds  Medication Sig  . aspirin  EC 81 MG tablet Take 81 mg by mouth daily.   . Lemborexant (DAYVIGO) 5 MG TABS Take 1 tablet by mouth at bedtime.  . metoprolol succinate (TOPROL-XL) 50 MG 24 hr tablet Take 1 tablet (50 mg total) by mouth daily.  Marland Kitchen telmisartan (MICARDIS) 40 MG tablet TAKE 1 TABLET BY MOUTH DAILY     Allergies:   Iohexol and Ivp dye [iodinated diagnostic agents]   Social History   Tobacco Use  . Smoking status: Never Smoker  . Smokeless tobacco: Never Used  Substance Use Topics  . Alcohol use: No    Alcohol/week: 0.0 standard drinks  . Drug use: No     Family  Hx: The patient's family history includes Cancer in his father and mother; Colon cancer in his maternal grandfather and paternal grandfather; Heart disease in his father; Hyperlipidemia in his father and mother; Hypertension in his father and mother. There is no history of Stomach cancer.  ROS:   Please see the history of present illness.   All other systems reviewed are negative.    Objective:    Vital Signs:  Ht 6\' 4"  (1.93 m)   BMI 32.26 kg/m    Wt Readings from Last 3 Encounters:  10/09/19 265 lb (120.2 kg)  07/10/19 271 lb (122.9 kg)  05/21/19 272 lb (123.4 kg)    Alert male in no acute distress. Sounds appropriate.    Labs/Other Tests and Data Reviewed:    Lab Results  Component Value Date   WBC 8.6 01/11/2019   HGB 15.5 01/11/2019   HCT 47.0 01/11/2019   PLT 266 01/11/2019   GLUCOSE 103 (H) 02/04/2019   CHOL 185 02/04/2019   TRIG 134.0 02/04/2019   HDL 39.30 02/04/2019   LDLCALC 119 (H) 02/04/2019   ALT 27 02/21/2018   AST 14 02/21/2018   NA 137 02/04/2019   K 4.3 02/04/2019   CL 103 02/04/2019   CREATININE 1.07 02/04/2019   BUN 19 02/04/2019   CO2 28 02/04/2019   TSH 1.27 02/04/2019   PSA 1.29 02/04/2019   HGBA1C 5.4 05/22/2017     BNP (last 3 results) No results for input(s): BNP in the last 8760 hours.  ProBNP (last 3 results) No results for input(s): PROBNP in the last 8760 hours.    Prior CV studies:    The following studies were reviewed today:  Monitor Study Highlights 04/2019    Sinus rhythm with minimum heart rate 41 bpm during sleep, average 70 bpm.  There occasional episodes of supraventricular tachycardia, fastest of which was approximately 200 bpm, longest approximately 30 minutes duration. Patient was symptomatic with these episodes.  Rare PVCs/PACs.-Occasionally symptomatic.  No pauses. No atrial fibrillation   Candee Furbish, MD   ECHO IMPRESSIONS 03/2019  1. Left ventricular ejection fraction, by estimation, is 60 to  65%. The  left ventricle has normal function. The left ventricle has no regional  wall motion abnormalities. Left ventricular diastolic parameters were  normal.  2. Right ventricular systolic function is normal. The right ventricular  size is normal.  3. The mitral valve is normal in structure. Trivial mitral valve  regurgitation. No evidence of mitral stenosis.  4. The aortic valve is normal in structure. Aortic valve regurgitation is  not visualized. No aortic stenosis is present.  5. Aortic dilatation noted. There is moderate dilatation of the ascending  aorta measuring 41 mm.   ASSESSMENT & PLAN:    1. SVT - on beta blocker therapy - no recurrence.  2. HTN - unclear control - he is recommended to monitor - goal 130/80 or less consistently. He is on ARB and beta blocker therapy.   3. Dilated ascending aorta - Dr. Marlou Porch has advised follow up echo in March of 2022.    Patient Risk:   After full review of this patient's clinical status, I feel that they are at least moderate risk at this time.  Time:   Today, I have spent 5 minutes with the patient with telehealth technology discussing the above issues.     Medication Adjustments/Labs and Tests Ordered: Current medicines are reviewed at length with the patient today.  Concerns regarding medicines are outlined above.   Tests Ordered: No orders of the defined types were placed in this encounter.   Medication Changes: No orders of the defined types were placed in this encounter.   Disposition:  FU with Dr. Marlou Porch in 6 months.  Patient is agreeable to this plan and will call if any problems develop in the interim.   Amie Critchley, NP  10/21/2019 8:15 AM    [place]

## 2019-10-14 NOTE — Telephone Encounter (Signed)
°  Patient Consent for Virtual Visit         Matthew Ressel. has provided verbal consent on 10/14/2019 for a virtual visit (video or telephone).   CONSENT FOR VIRTUAL VISIT FOR:  Matthew Grant.  By participating in this virtual visit I agree to the following:  I hereby voluntarily request, consent and authorize [facility] and its employed or contracted physicians, physician assistants, nurse practitioners or other licensed health care professionals (the Practitioner), to provide me with telemedicine health care services (the Services") as deemed necessary by the treating Practitioner. I acknowledge and consent to receive the Services by the Practitioner via telemedicine. I understand that the telemedicine visit will involve communicating with the Practitioner through live audiovisual communication technology and the disclosure of certain medical information by electronic transmission. I acknowledge that I have been given the opportunity to request an in-person assessment or other available alternative prior to the telemedicine visit and am voluntarily participating in the telemedicine visit.  I understand that I have the right to withhold or withdraw my consent to the use of telemedicine in the course of my care at any time, without affecting my right to future care or treatment, and that the Practitioner or I may terminate the telemedicine visit at any time. I understand that I have the right to inspect all information obtained and/or recorded in the course of the telemedicine visit and may receive copies of available information for a reasonable fee.  I understand that some of the potential risks of receiving the Services via telemedicine include:   Delay or interruption in medical evaluation due to technological equipment failure or disruption;  Information transmitted may not be sufficient (e.g. poor resolution of images) to allow for appropriate medical decision making by the  Practitioner; and/or   In rare instances, security protocols could fail, causing a breach of personal health information.  Furthermore, I acknowledge that it is my responsibility to provide information about my medical history, conditions and care that is complete and accurate to the best of my ability. I acknowledge that Practitioner's advice, recommendations, and/or decision may be based on factors not within their control, such as incomplete or inaccurate data provided by me or distortions of diagnostic images or specimens that may result from electronic transmissions. I understand that the practice of medicine is not an exact science and that Practitioner makes no warranties or guarantees regarding treatment outcomes. I acknowledge that a copy of this consent can be made available to me via my patient portal ([facility]), or I can request a printed copy by calling the office of [facility].    I understand that my insurance will be billed for this visit.   I have read or had this consent read to me.  I understand the contents of this consent, which adequately explains the benefits and risks of the Services being provided via telemedicine.   I have been provided ample opportunity to ask questions regarding this consent and the Services and have had my questions answered to my satisfaction.  I give my informed consent for the services to be provided through the use of telemedicine in my medical care

## 2019-10-20 ENCOUNTER — Other Ambulatory Visit: Payer: Self-pay | Admitting: Internal Medicine

## 2019-10-20 DIAGNOSIS — M170 Bilateral primary osteoarthritis of knee: Secondary | ICD-10-CM

## 2019-10-21 ENCOUNTER — Encounter: Payer: Self-pay | Admitting: Nurse Practitioner

## 2019-10-21 ENCOUNTER — Telehealth (INDEPENDENT_AMBULATORY_CARE_PROVIDER_SITE_OTHER): Payer: 59 | Admitting: Nurse Practitioner

## 2019-10-21 ENCOUNTER — Other Ambulatory Visit: Payer: Self-pay

## 2019-10-21 VITALS — Ht 76.0 in

## 2019-10-21 DIAGNOSIS — I10 Essential (primary) hypertension: Secondary | ICD-10-CM

## 2019-10-21 DIAGNOSIS — I471 Supraventricular tachycardia: Secondary | ICD-10-CM | POA: Diagnosis not present

## 2019-10-21 DIAGNOSIS — Z7189 Other specified counseling: Secondary | ICD-10-CM

## 2019-10-21 DIAGNOSIS — R0789 Other chest pain: Secondary | ICD-10-CM

## 2019-10-21 DIAGNOSIS — R002 Palpitations: Secondary | ICD-10-CM | POA: Diagnosis not present

## 2019-10-21 DIAGNOSIS — I77810 Thoracic aortic ectasia: Secondary | ICD-10-CM | POA: Diagnosis not present

## 2019-10-21 NOTE — Patient Instructions (Addendum)
After Visit Summary:  We will be checking the following labs today - NONE   Medication Instructions:    Continue with your current medicines.    If you need a refill on your cardiac medications before your next appointment, please call your pharmacy.     Testing/Procedures To Be Arranged:  N/A  Follow-Up:   See Dr. Marlou Porch in 6 months.     At [facility], you and your health needs are our priority.  As part of our continuing mission to provide you with exceptional heart care, we have created designated Provider Care Teams.  These Care Teams include your primary Cardiologist (physician) and Advanced Practice Providers (APPs -  Physician Assistants and Nurse Practitioners) who all work together to provide you with the care you need, when you need it.  Special Instructions:  . Stay safe, wash your hands for at least 20 seconds and wear a mask when needed.  . It was good to talk with you today.  . Try to check some blood pressures for [place] periodically. Goal is less than 130/80 most of the time.    Call the [place] office at [phone] if you have any questions, problems or concerns.

## 2019-11-12 ENCOUNTER — Other Ambulatory Visit: Payer: Self-pay | Admitting: Internal Medicine

## 2019-11-12 ENCOUNTER — Encounter: Payer: Self-pay | Admitting: Internal Medicine

## 2019-11-12 DIAGNOSIS — K635 Polyp of colon: Secondary | ICD-10-CM | POA: Insufficient documentation

## 2019-12-06 ENCOUNTER — Encounter: Payer: Self-pay | Admitting: Internal Medicine

## 2020-01-11 ENCOUNTER — Emergency Department (HOSPITAL_COMMUNITY): Payer: No Typology Code available for payment source

## 2020-01-11 ENCOUNTER — Other Ambulatory Visit: Payer: Self-pay

## 2020-01-11 ENCOUNTER — Encounter (HOSPITAL_COMMUNITY): Payer: Self-pay | Admitting: Emergency Medicine

## 2020-01-11 ENCOUNTER — Emergency Department (HOSPITAL_COMMUNITY)
Admission: EM | Admit: 2020-01-11 | Discharge: 2020-01-11 | Disposition: A | Discharge status: Home / Self Care | Payer: No Typology Code available for payment source | Attending: Emergency Medicine | Admitting: Emergency Medicine

## 2020-01-11 DIAGNOSIS — R109 Unspecified abdominal pain: Secondary | ICD-10-CM | POA: Insufficient documentation

## 2020-01-11 DIAGNOSIS — M25552 Pain in left hip: Secondary | ICD-10-CM

## 2020-01-11 DIAGNOSIS — R519 Headache, unspecified: Secondary | ICD-10-CM | POA: Insufficient documentation

## 2020-01-11 DIAGNOSIS — R079 Chest pain, unspecified: Secondary | ICD-10-CM

## 2020-01-11 DIAGNOSIS — Z96652 Presence of left artificial knee joint: Secondary | ICD-10-CM | POA: Insufficient documentation

## 2020-01-11 DIAGNOSIS — Z87442 Personal history of urinary calculi: Secondary | ICD-10-CM | POA: Insufficient documentation

## 2020-01-11 DIAGNOSIS — M25512 Pain in left shoulder: Secondary | ICD-10-CM | POA: Insufficient documentation

## 2020-01-11 DIAGNOSIS — I10 Essential (primary) hypertension: Secondary | ICD-10-CM | POA: Insufficient documentation

## 2020-01-11 DIAGNOSIS — Z7982 Long term (current) use of aspirin: Secondary | ICD-10-CM | POA: Insufficient documentation

## 2020-01-11 DIAGNOSIS — T1490XA Injury, unspecified, initial encounter: Secondary | ICD-10-CM

## 2020-01-11 DIAGNOSIS — M79602 Pain in left arm: Secondary | ICD-10-CM

## 2020-01-11 DIAGNOSIS — S0990XA Unspecified injury of head, initial encounter: Secondary | ICD-10-CM

## 2020-01-11 DIAGNOSIS — M25522 Pain in left elbow: Secondary | ICD-10-CM | POA: Insufficient documentation

## 2020-01-11 DIAGNOSIS — M542 Cervicalgia: Secondary | ICD-10-CM | POA: Insufficient documentation

## 2020-01-11 DIAGNOSIS — Z79899 Other long term (current) drug therapy: Secondary | ICD-10-CM | POA: Diagnosis not present

## 2020-01-11 DIAGNOSIS — R112 Nausea with vomiting, unspecified: Secondary | ICD-10-CM | POA: Diagnosis not present

## 2020-01-11 LAB — COMPREHENSIVE METABOLIC PANEL
ALT: 33 U/L (ref 0–44)
AST: 22 U/L (ref 15–41)
Albumin: 3.8 g/dL (ref 3.5–5.0)
Alkaline Phosphatase: 69 U/L (ref 38–126)
Anion gap: 9 (ref 5–15)
BUN: 15 mg/dL (ref 6–20)
CO2: 25 mmol/L (ref 22–32)
Calcium: 8.6 mg/dL — ABNORMAL LOW (ref 8.9–10.3)
Chloride: 105 mmol/L (ref 98–111)
Creatinine, Ser: 1.07 mg/dL (ref 0.61–1.24)
GFR, Estimated: >60 mL/min (ref >60)
Glucose, Bld: 87 mg/dL (ref 70–99)
Potassium: 4.3 mmol/L (ref 3.5–5.1)
Sodium: 139 mmol/L (ref 135–145)
Total Bilirubin: 0.8 mg/dL (ref 0.3–1.2)
Total Protein: 6.3 g/dL — ABNORMAL LOW (ref 6.5–8.1)

## 2020-01-11 LAB — CBC
HCT: 46.8 % (ref 39.0–52.0)
Hemoglobin: 15.3 g/dL (ref 13.0–17.0)
MCH: 28.5 pg (ref 26.0–34.0)
MCHC: 32.7 g/dL (ref 30.0–36.0)
MCV: 87.3 fL (ref 80.0–100.0)
Platelets: 224 10*3/uL (ref 150–400)
RBC: 5.36 MIL/uL (ref 4.22–5.81)
RDW: 13.4 % (ref 11.5–15.5)
WBC: 7.4 10*3/uL (ref 4.0–10.5)
nRBC: 0 % (ref 0.0–0.2)

## 2020-01-11 LAB — SAMPLE TO BLOOD BANK

## 2020-01-11 MED ORDER — CYCLOBENZAPRINE HCL 10 MG PO TABS: 10.0000 mg | ORAL_TABLET | Freq: Two times a day (BID) | ORAL | 0 refills | Status: DC | PRN

## 2020-01-11 MED ORDER — OXYCODONE-ACETAMINOPHEN 5-325 MG PO TABS
1.0000 | ORAL_TABLET | Freq: Once | ORAL | Status: AC
  Administered 2020-01-11: 1 via ORAL
  Filled 2020-01-11: qty 1
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
## 2020-01-11 NOTE — ED Notes (Signed)
Pt ambulated without assist, gait steady and even. Pt reports he is ready for discharge

## 2020-01-11 NOTE — ED Notes (Signed)
Patient transported to CT 
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
## 2020-01-11 NOTE — Discharge Instructions (Addendum)
Please read instructions below. Apply ice to areas of pain for 20 minutes at a time You can take 600 mg of Advil/ibuprofen every 6 hours as needed for muscle aches or headache. You can take flexeril every 12 hours as needed for muscle spasm. Be aware this medication can make you drowsy. Stay hydrated and get plenty of rest. Limit your screen time and complex thinking. Avoid any contact sports/activities to prevent re-injury to your head. Follow up with your primary care provider in 1 week for re-check and to be cleared to return to normal activity. Return to the ER if you develop severely worsening headache, changes in your vision, persistent vomiting, or new or concerning symptoms.

## 2020-01-13 ENCOUNTER — Encounter: Payer: Self-pay | Admitting: Internal Medicine

## 2020-01-13 NOTE — Telephone Encounter (Addendum)
° °  Lattie Haw from [facility] calling to clarify work note Is desk duty appropriate for today and return full duty on 12/21?  Please call [phone]

## 2020-01-14 ENCOUNTER — Ambulatory Visit (AMBULATORY_SURGERY_CENTER): Payer: Self-pay | Admitting: *Deleted

## 2020-01-14 ENCOUNTER — Other Ambulatory Visit: Payer: Self-pay

## 2020-01-14 VITALS — Ht 76.0 in | Wt 270.0 lb

## 2020-01-14 DIAGNOSIS — Z86010 Personal history of colonic polyps: Secondary | ICD-10-CM

## 2020-01-14 MED ORDER — PLENVU 140 G PO SOLR: 1.0000 | Freq: Once | ORAL | 0 refills | Status: AC

## 2020-01-14 NOTE — Progress Notes (Signed)
Completed covid vaccines x3  Pt is aware that care partner will wait in the car during procedure; if they feel like they will be too hot or cold to wait in the car; they may wait in the 4 th floor lobby. Patient is aware to bring only one care partner. We want them to wear a mask (we do not have any that we can provide them), practice social distancing, and we will check their temperatures when they get here.  I did remind the patient that their care partner needs to stay in the parking lot the entire time and have a cell phone available, we will call them when the pt is ready for discharge. Patient will wear mask into building.  No egg or soy allergy  No home oxygen use or problems with anesthesia  No medications for weight loss taken  Denies constipation   No trouble with anesthesia, difficulty with intubation or hx/fam hx of malignant hyperthermia per pt  plenvu coupon sent to pt and code put into RX  Pt's previsit is done over the phone and all paperwork (prep instructions, blank consent form to just read over, pre-procedure acknowledgement form and stamped envelope) sent to patient

## 2020-01-31 ENCOUNTER — Encounter: Payer: Self-pay | Admitting: Gastroenterology

## 2020-02-06 ENCOUNTER — Ambulatory Visit (INDEPENDENT_AMBULATORY_CARE_PROVIDER_SITE_OTHER): Payer: 59 | Admitting: Internal Medicine

## 2020-02-06 ENCOUNTER — Encounter: Payer: Self-pay | Admitting: Internal Medicine

## 2020-02-06 ENCOUNTER — Other Ambulatory Visit: Payer: Self-pay

## 2020-02-06 VITALS — BP 126/86 | HR 61 | Temp 98.0°F | Resp 16 | Ht 76.0 in | Wt 280.0 lb

## 2020-02-06 DIAGNOSIS — Z0000 Encounter for general adult medical examination without abnormal findings: Secondary | ICD-10-CM | POA: Diagnosis not present

## 2020-02-06 DIAGNOSIS — Z125 Encounter for screening for malignant neoplasm of prostate: Secondary | ICD-10-CM | POA: Diagnosis not present

## 2020-02-06 DIAGNOSIS — R3121 Asymptomatic microscopic hematuria: Secondary | ICD-10-CM | POA: Diagnosis not present

## 2020-02-06 DIAGNOSIS — Z23 Encounter for immunization: Secondary | ICD-10-CM | POA: Diagnosis not present

## 2020-02-06 DIAGNOSIS — I10 Essential (primary) hypertension: Secondary | ICD-10-CM | POA: Diagnosis not present

## 2020-02-06 LAB — CBC WITH DIFFERENTIAL/PLATELET
Basophils Absolute: 0.1 10*3/uL (ref 0.0–0.1)
Basophils Relative: 0.9 % (ref 0.0–3.0)
Eosinophils Absolute: 0.1 10*3/uL (ref 0.0–0.7)
Eosinophils Relative: 2.2 % (ref 0.0–5.0)
HCT: 45.3 % (ref 39.0–52.0)
Hemoglobin: 15.6 g/dL (ref 13.0–17.0)
Lymphocytes Relative: 29.5 % (ref 12.0–46.0)
Lymphs Abs: 1.8 10*3/uL (ref 0.7–4.0)
MCHC: 34.3 g/dL (ref 30.0–36.0)
MCV: 84.9 fl (ref 78.0–100.0)
Monocytes Absolute: 0.3 10*3/uL (ref 0.1–1.0)
Monocytes Relative: 5.7 % (ref 3.0–12.0)
Neutro Abs: 3.8 10*3/uL (ref 1.4–7.7)
Neutrophils Relative %: 61.7 % (ref 43.0–77.0)
Platelets: 226 10*3/uL (ref 150.0–400.0)
RBC: 5.34 Mil/uL (ref 4.22–5.81)
RDW: 14.6 % (ref 11.5–15.5)
WBC: 6.1 10*3/uL (ref 4.0–10.5)

## 2020-02-06 LAB — URINALYSIS, ROUTINE W REFLEX MICROSCOPIC
Bilirubin Urine: NEGATIVE
Ketones, ur: NEGATIVE
Leukocytes,Ua: NEGATIVE
Nitrite: NEGATIVE
Specific Gravity, Urine: >=1.03 — AB (ref 1.000–1.030)
Total Protein, Urine: NEGATIVE
Urine Glucose: NEGATIVE
Urobilinogen, UA: 0.2 (ref 0.0–1.0)
pH: 5.5 (ref 5.0–8.0)

## 2020-02-06 LAB — HEPATIC FUNCTION PANEL
ALT: 33 U/L (ref 0–53)
AST: 15 U/L (ref 0–37)
Albumin: 4.5 g/dL (ref 3.5–5.2)
Alkaline Phosphatase: 74 U/L (ref 39–117)
Bilirubin, Direct: 0.1 mg/dL (ref 0.0–0.3)
Total Bilirubin: 0.7 mg/dL (ref 0.2–1.2)
Total Protein: 6.6 g/dL (ref 6.0–8.3)

## 2020-02-06 LAB — BASIC METABOLIC PANEL
BUN: 18 mg/dL (ref 6–23)
CO2: 29 mEq/L (ref 19–32)
Calcium: 8.9 mg/dL (ref 8.4–10.5)
Chloride: 103 mEq/L (ref 96–112)
Creatinine, Ser: 1.05 mg/dL (ref 0.40–1.50)
GFR: 78.2 mL/min (ref >60.00)
Glucose, Bld: 99 mg/dL (ref 70–99)
Potassium: 4.2 mEq/L (ref 3.5–5.1)
Sodium: 139 mEq/L (ref 135–145)

## 2020-02-06 LAB — LIPID PANEL
Cholesterol: 188 mg/dL (ref 0–200)
HDL: 41.5 mg/dL (ref >39.00)
LDL Cholesterol: 117 mg/dL — ABNORMAL HIGH (ref 0–99)
NonHDL: 146.91
Total CHOL/HDL Ratio: 5
Triglycerides: 148 mg/dL (ref 0.0–149.0)
VLDL: 29.6 mg/dL (ref 0.0–40.0)

## 2020-02-06 LAB — TSH: TSH: 1.43 u[IU]/mL (ref 0.35–4.50)

## 2020-02-06 LAB — PSA: PSA: 1.25 ng/mL (ref 0.10–4.00)

## 2020-02-06 NOTE — Patient Instructions (Signed)
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
## 2020-02-07 ENCOUNTER — Encounter: Payer: 59 | Admitting: Internal Medicine

## 2020-02-28 ENCOUNTER — Encounter: Payer: Self-pay | Admitting: Internal Medicine

## 2020-02-28 ENCOUNTER — Ambulatory Visit (AMBULATORY_SURGERY_CENTER): Payer: 59 | Admitting: Internal Medicine

## 2020-02-28 ENCOUNTER — Other Ambulatory Visit: Payer: Self-pay

## 2020-02-28 VITALS — BP 144/86 | HR 65 | Temp 97.5°F | Resp 21 | Ht 76.0 in | Wt 270.0 lb

## 2020-02-28 DIAGNOSIS — D128 Benign neoplasm of rectum: Secondary | ICD-10-CM

## 2020-02-28 DIAGNOSIS — Z86010 Personal history of colonic polyps: Secondary | ICD-10-CM | POA: Diagnosis not present

## 2020-02-28 LAB — HM COLONOSCOPY

## 2020-02-28 MED ORDER — SODIUM CHLORIDE 0.9 % IV SOLN: 500.0000 mL | Freq: Once | INTRAVENOUS | Status: DC

## 2020-02-28 NOTE — Progress Notes (Signed)
A and O x3. Report to RN. Tolerated MAC anesthesia well.

## 2020-02-28 NOTE — Progress Notes (Signed)
Called to room to assist during endoscopic procedure.  Patient ID and intended procedure confirmed with present staff. Received instructions for my participation in the procedure from the performing physician.  

## 2020-02-28 NOTE — Patient Instructions (Signed)
Handouts given for polyps and diverticulosis.  Await pathology results.  YOU HAD AN ENDOSCOPIC PROCEDURE TODAY AT THE [facility]:   Refer to the procedure report that was given to you for any specific questions about what was found during the examination.  If the procedure report does not answer your questions, please call your gastroenterologist to clarify.  If you requested that your care partner not be given the details of your procedure findings, then the procedure report has been included in a sealed envelope for you to review at your convenience later.  YOU SHOULD EXPECT: Some feelings of bloating in the abdomen. Passage of more gas than usual.  Walking can help get rid of the air that was put into your GI tract during the procedure and reduce the bloating. If you had a lower endoscopy (such as a colonoscopy or flexible sigmoidoscopy) you may notice spotting of blood in your stool or on the toilet paper. If you underwent a bowel prep for your procedure, you may not have a normal bowel movement for a few days.  Please Note:  You might notice some irritation and congestion in your nose or some drainage.  This is from the oxygen used during your procedure.  There is no need for concern and it should clear up in a day or so.  SYMPTOMS TO REPORT IMMEDIATELY:   Following lower endoscopy (colonoscopy or flexible sigmoidoscopy):  Excessive amounts of blood in the stool  Significant tenderness or worsening of abdominal pains  Swelling of the abdomen that is new, acute  Fever of 100F or higher  For urgent or emergent issues, a gastroenterologist can be reached at any hour by calling [phone]. Do not use MyChart messaging for urgent concerns.    DIET:  We do recommend a small meal at first, but then you may proceed to your regular diet.  Drink plenty of fluids but you should avoid alcoholic beverages for 24 hours.  ACTIVITY:  You should plan to take it easy for the rest of  today and you should NOT DRIVE or use heavy machinery until tomorrow (because of the sedation medicines used during the test).    FOLLOW UP: Our staff will call the number listed on your records 48-72 hours following your procedure to check on you and address any questions or concerns that you may have regarding the information given to you following your procedure. If we do not reach you, we will leave a message.  We will attempt to reach you two times.  During this call, we will ask if you have developed any symptoms of COVID 19. If you develop any symptoms (ie: fever, flu-like symptoms, shortness of breath, cough etc.) before then, please call [phone].  If you test positive for Covid 19 in the 2 weeks post procedure, please call and report this information to us.    If any biopsies were taken you will be contacted by phone or by letter within the next 1-3 weeks.  Please call us at [phone] if you have not heard about the biopsies in 3 weeks.    SIGNATURES/CONFIDENTIALITY: You and/or your care partner have signed paperwork which will be entered into your electronic medical record.  These signatures attest to the fact that that the information above on your After Visit Summary has been reviewed and is understood.  Full responsibility of the confidentiality of this discharge information lies with you and/or your care-partner. 

## 2020-02-28 NOTE — Progress Notes (Signed)
Vitals by SB,  BP 154/101 upon arrival.  CRNA Carlis Abbott Powers made aware.  No orders received.

## 2020-02-28 NOTE — Op Note (Signed)
[facility] Patient Name: Matthew Grant Procedure Date: 02/28/2020 3:29 PM MRN: [id number] Endoscopist: Jerene Bears , MD Age: 60 Referring MD:  Date of Birth: [birth year] Gender: Male Account #: [id number] Procedure:                Colonoscopy Indications:              Surveillance: Personal history of adenomatous                            polyps, initial colonoscopy 2013 (3 adenomas with                            one being > 1 cm), last colonoscopy 12/2014                            inflammatory polyp Medicines:                Monitored Anesthesia Care Procedure:                Pre-Anesthesia Assessment:                           - Prior to the procedure, a History and Physical                            was performed, and patient medications and                            allergies were reviewed. The patient's tolerance of                            previous anesthesia was also reviewed. The risks                            and benefits of the procedure and the sedation                            options and risks were discussed with the patient.                            All questions were answered, and informed consent                            was obtained. Prior Anticoagulants: The patient has                            taken no previous anticoagulant or antiplatelet                            agents. ASA Grade Assessment: II - A patient with                            mild systemic disease. After reviewing the risks  and benefits, the patient was deemed in                            satisfactory condition to undergo the procedure.                           After obtaining informed consent, the colonoscope                            was passed under direct vision. Throughout the                            procedure, the patient's blood pressure, pulse, and                            oxygen saturations were monitored continuously. The                             Olympus CF-HQ190L ([id number]) Colonoscope was                            introduced through the anus and advanced to the                            cecum, identified by appendiceal orifice and                            ileocecal valve. The colonoscopy was performed                            somewhat difficult due to looping requiring manual                            counterpressure. The patient tolerated the                            procedure well. The quality of the bowel                            preparation was good. The ileocecal valve,                            appendiceal orifice, and rectum were photographed. Scope In: 3:49:12 PM Scope Out: 4:11:29 PM Scope Withdrawal Time: 0 hours 9 minutes 51 seconds  Total Procedure Duration: 0 hours 22 minutes 17 seconds  Findings:                 The digital rectal exam was normal.                           Multiple small and large-mouthed diverticula were                            found in the sigmoid colon and descending colon.  A 4 mm polyp was found in the rectum. The polyp was                            sessile. The polyp was removed with a cold snare.                            Resection and retrieval were complete.                           The retroflexed view of the distal rectum and anal                            verge was normal and showed no anal or rectal                            abnormalities. Complications:            No immediate complications. Estimated Blood Loss:     Estimated blood loss: none. Impression:               - Diverticulosis in the sigmoid colon and in the                            descending colon.                           - One 4 mm polyp in the rectum, removed with a cold                            snare. Resected and retrieved.                           - The distal rectum and anal verge are normal on                            retroflexion  view. Recommendation:           - Patient has a contact number available for                            emergencies. The signs and symptoms of potential                            delayed complications were discussed with the                            patient. Return to normal activities tomorrow.                            Written discharge instructions were provided to the                            patient.                           -  Resume previous diet.                           - Continue present medications.                           - Await pathology results.                           - Repeat colonoscopy is recommended for                            surveillance. The colonoscopy date will be                            determined after pathology results from today's                            exam become available for review. Jerene Bears, MD 02/28/2020 4:16:49 PM This report has been signed electronically.

## 2020-03-03 ENCOUNTER — Telehealth: Payer: Self-pay | Admitting: *Deleted

## 2020-03-03 NOTE — Telephone Encounter (Signed)
  Follow up Call-  Call back number 02/28/2020  Post procedure Call Back phone  # [phone]  Permission to leave phone message Yes  Some recent data might be hidden     Patient questions:  Do you have a fever, pain , or abdominal swelling? No. Pain Score  0 *  Have you tolerated food without any problems? Yes.    Have you been able to return to your normal activities? Yes.    Do you have any questions about your discharge instructions: Diet   No. Medications  No. Follow up visit  No.  Do you have questions or concerns about your Care? No.  Actions: * If pain score is 4 or above: No action needed, pain <4.  1. Have you developed a fever since your procedure? no  2.   Have you had an respiratory symptoms (SOB or cough) since your procedure? no  3.   Have you tested positive for COVID 19 since your procedure no  4.   Have you had any family members/close contacts diagnosed with the COVID 19 since your procedure?  no   If yes to any of these questions please route to Joylene John, RN and Joella Prince, RN

## 2020-03-06 ENCOUNTER — Encounter: Payer: Self-pay | Admitting: Internal Medicine

## 2020-03-12 ENCOUNTER — Other Ambulatory Visit: Payer: Self-pay | Admitting: Internal Medicine

## 2020-03-21 ENCOUNTER — Other Ambulatory Visit: Payer: Self-pay | Admitting: Internal Medicine

## 2020-03-21 DIAGNOSIS — I10 Essential (primary) hypertension: Secondary | ICD-10-CM

## 2020-04-03 ENCOUNTER — Ambulatory Visit (HOSPITAL_COMMUNITY): Payer: 59 | Attending: Internal Medicine

## 2020-04-03 ENCOUNTER — Other Ambulatory Visit: Payer: Self-pay

## 2020-04-03 DIAGNOSIS — I77810 Thoracic aortic ectasia: Secondary | ICD-10-CM | POA: Insufficient documentation

## 2020-04-03 LAB — ECHOCARDIOGRAM COMPLETE
Area-P 1/2: 2.95 cm2
S' Lateral: 3.65 cm

## 2020-04-03 MED ORDER — PERFLUTREN LIPID MICROSPHERE
1.0000 mL | INTRAVENOUS | Status: AC | PRN
  Administered 2020-04-03: 2 mL via INTRAVENOUS

## 2020-04-10 ENCOUNTER — Ambulatory Visit: Payer: 59 | Admitting: Cardiology

## 2020-04-10 ENCOUNTER — Other Ambulatory Visit: Payer: Self-pay

## 2020-04-10 ENCOUNTER — Encounter: Payer: Self-pay | Admitting: Cardiology

## 2020-04-10 VITALS — BP 110/70 | HR 60 | Ht 76.0 in | Wt 278.0 lb

## 2020-04-10 DIAGNOSIS — I77810 Thoracic aortic ectasia: Secondary | ICD-10-CM

## 2020-04-10 DIAGNOSIS — I471 Supraventricular tachycardia: Secondary | ICD-10-CM | POA: Diagnosis not present
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
## 2020-04-10 NOTE — Patient Instructions (Signed)
Medication Instructions:  The current medical regimen is effective;  continue present plan and medications.  *If you need a refill on your cardiac medications before your next appointment, please call your pharmacy*  Follow-Up: At [facility], you and your health needs are our priority.  As part of our continuing mission to provide you with exceptional heart care, we have created designated Provider Care Teams.  These Care Teams include your primary Cardiologist (physician) and Advanced Practice Providers (APPs -  Physician Assistants and Nurse Practitioners) who all work together to provide you with the care you need, when you need it.  We recommend signing up for the patient portal called "MyChart".  Sign up information is provided on this After Visit Summary.  MyChart is used to connect with patients for Virtual Visits (Telemedicine).  Patients are able to view lab/test results, encounter notes, upcoming appointments, etc.  Non-urgent messages can be sent to your provider as well.   To learn more about what you can do with MyChart, go to [email or web address].    Your next appointment:   12 month(s)  The format for your next appointment:   In Person  Provider:   Mark Skains, MD   Thank you for choosing [facility]!!      

## 2020-06-24 ENCOUNTER — Other Ambulatory Visit: Payer: Self-pay | Admitting: Internal Medicine

## 2020-06-24 DIAGNOSIS — I10 Essential (primary) hypertension: Secondary | ICD-10-CM

## 2020-08-16 ENCOUNTER — Encounter (HOSPITAL_COMMUNITY): Payer: Self-pay | Admitting: *Deleted

## 2020-08-16 ENCOUNTER — Ambulatory Visit (HOSPITAL_COMMUNITY)
Admission: EM | Admit: 2020-08-16 | Discharge: 2020-08-16 | Disposition: A | Discharge status: Home / Self Care | Payer: 59 | Attending: Emergency Medicine | Admitting: Emergency Medicine

## 2020-08-16 ENCOUNTER — Other Ambulatory Visit: Payer: Self-pay

## 2020-08-16 ENCOUNTER — Ambulatory Visit (INDEPENDENT_AMBULATORY_CARE_PROVIDER_SITE_OTHER): Payer: 59

## 2020-08-16 DIAGNOSIS — U071 COVID-19: Secondary | ICD-10-CM

## 2020-08-16 DIAGNOSIS — R0600 Dyspnea, unspecified: Secondary | ICD-10-CM

## 2020-08-16 DIAGNOSIS — R059 Cough, unspecified: Secondary | ICD-10-CM | POA: Diagnosis not present

## 2020-08-16 MED ORDER — MOLNUPIRAVIR EUA 200MG CAPSULE: 4.0000 | ORAL_CAPSULE | Freq: Two times a day (BID) | ORAL | 0 refills | Status: AC

## 2020-08-16 NOTE — Discharge Instructions (Addendum)
We will contact you if your COVID test is positive.  Please quarantine while you wait for the results.  If your test is negative you may resume normal activities.  If your test is positive please continue to quarantine for at least 5 days from your symptom onset or until you are without a fever for at least 24 hours after the medications.  You can start the molnupiravir today or wait until tomorrow when you receive your COVID results.    You can take Tylenol and/or Ibuprofen as needed for fever reduction and pain relief.   For cough: honey 1/2 to 1 teaspoon (you can dilute the honey in water or another fluid).  You can also use guaifenesin and dextromethorphan for cough. You can use a humidifier for chest congestion and cough.  If you don't have a humidifier, you can sit in the bathroom with the hot shower running.     For sore throat: try warm salt water gargles, cepacol lozenges, throat spray, warm tea or water with lemon/honey, popsicles or ice, or OTC cold relief medicine for throat discomfort.    For congestion: take a daily anti-histamine like Zyrtec, Claritin, and a oral decongestant, such as pseudoephedrine.  You can also use Flonase 1-2 sprays in each nostril daily.    It is important to stay hydrated: drink plenty of fluids (water, gatorade/powerade/pedialyte, juices, or teas) to keep your throat moisturized and help further relieve irritation/discomfort.   Return or go to the Emergency Department if symptoms worsen or do not improve in the next few days.
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
## 2020-08-16 NOTE — ED Triage Notes (Signed)
Pt reports hr tested Positive for COVID with Home test  Yesterday. Pt's wife also test Positive .

## 2020-08-17 ENCOUNTER — Telehealth: Payer: Self-pay | Admitting: Internal Medicine

## 2020-08-17 LAB — SARS CORONAVIRUS 2 (TAT 6-24 HRS): SARS Coronavirus 2: POSITIVE — AB

## 2020-08-17 NOTE — Telephone Encounter (Signed)
[facility] FYI 7.23.22:  ---Caller states her husband has tested for COVID today. He is currently fatigue,not eating,aching,scratching throat and feels bad. Has unreliable thermometer. Symptoms started Friday.  Advised to go to ED

## 2020-08-21 ENCOUNTER — Emergency Department (HOSPITAL_COMMUNITY): Payer: 59

## 2020-08-21 ENCOUNTER — Emergency Department (HOSPITAL_COMMUNITY)
Admission: EM | Admit: 2020-08-21 | Discharge: 2020-08-21 | Disposition: A | Discharge status: Home / Self Care | Payer: 59 | Attending: Emergency Medicine | Admitting: Emergency Medicine

## 2020-08-21 ENCOUNTER — Encounter: Payer: Self-pay | Admitting: Internal Medicine

## 2020-08-21 DIAGNOSIS — I10 Essential (primary) hypertension: Secondary | ICD-10-CM | POA: Diagnosis not present

## 2020-08-21 DIAGNOSIS — Z79899 Other long term (current) drug therapy: Secondary | ICD-10-CM | POA: Insufficient documentation

## 2020-08-21 DIAGNOSIS — Z8616 Personal history of COVID-19: Secondary | ICD-10-CM | POA: Insufficient documentation

## 2020-08-21 DIAGNOSIS — Z87442 Personal history of urinary calculi: Secondary | ICD-10-CM | POA: Diagnosis not present

## 2020-08-21 DIAGNOSIS — Z86010 Personal history of colonic polyps: Secondary | ICD-10-CM | POA: Diagnosis not present

## 2020-08-21 DIAGNOSIS — R001 Bradycardia, unspecified: Secondary | ICD-10-CM | POA: Insufficient documentation

## 2020-08-21 DIAGNOSIS — R112 Nausea with vomiting, unspecified: Secondary | ICD-10-CM | POA: Insufficient documentation

## 2020-08-21 DIAGNOSIS — R1031 Right lower quadrant pain: Secondary | ICD-10-CM | POA: Insufficient documentation

## 2020-08-21 DIAGNOSIS — R42 Dizziness and giddiness: Secondary | ICD-10-CM | POA: Diagnosis not present

## 2020-08-21 DIAGNOSIS — R109 Unspecified abdominal pain: Secondary | ICD-10-CM

## 2020-08-21 DIAGNOSIS — N200 Calculus of kidney: Secondary | ICD-10-CM

## 2020-08-21 LAB — URINALYSIS, MICROSCOPIC (REFLEX): RBC / HPF: >50 RBC/hpf (ref 0–5)

## 2020-08-21 LAB — CBC WITH DIFFERENTIAL/PLATELET
Abs Immature Granulocytes: 0.02 10*3/uL (ref 0.00–0.07)
Basophils Absolute: 0 10*3/uL (ref 0.0–0.1)
Basophils Relative: 1 %
Eosinophils Absolute: 0.1 10*3/uL (ref 0.0–0.5)
Eosinophils Relative: 1 %
HCT: 42.8 % (ref 39.0–52.0)
Hemoglobin: 14.4 g/dL (ref 13.0–17.0)
Immature Granulocytes: 0 %
Lymphocytes Relative: 44 %
Lymphs Abs: 3.8 10*3/uL (ref 0.7–4.0)
MCH: 29.1 pg (ref 26.0–34.0)
MCHC: 33.6 g/dL (ref 30.0–36.0)
MCV: 86.5 fL (ref 80.0–100.0)
Monocytes Absolute: 0.4 10*3/uL (ref 0.1–1.0)
Monocytes Relative: 4 %
Neutro Abs: 4.3 10*3/uL (ref 1.7–7.7)
Neutrophils Relative %: 50 %
Platelets: 286 10*3/uL (ref 150–400)
RBC: 4.95 MIL/uL (ref 4.22–5.81)
RDW: 13.3 % (ref 11.5–15.5)
WBC: 8.6 10*3/uL (ref 4.0–10.5)
nRBC: 0 % (ref 0.0–0.2)

## 2020-08-21 LAB — COMPREHENSIVE METABOLIC PANEL
ALT: 31 U/L (ref 0–44)
AST: 19 U/L (ref 15–41)
Albumin: 4 g/dL (ref 3.5–5.0)
Alkaline Phosphatase: 64 U/L (ref 38–126)
Anion gap: 13 (ref 5–15)
BUN: 16 mg/dL (ref 6–20)
CO2: 24 mmol/L (ref 22–32)
Calcium: 8.8 mg/dL — ABNORMAL LOW (ref 8.9–10.3)
Chloride: 101 mmol/L (ref 98–111)
Creatinine, Ser: 1.15 mg/dL (ref 0.61–1.24)
GFR, Estimated: >60 mL/min (ref >60)
Glucose, Bld: 127 mg/dL — ABNORMAL HIGH (ref 70–99)
Potassium: 3.6 mmol/L (ref 3.5–5.1)
Sodium: 138 mmol/L (ref 135–145)
Total Bilirubin: 1 mg/dL (ref 0.3–1.2)
Total Protein: 6.5 g/dL (ref 6.5–8.1)

## 2020-08-21 LAB — URINALYSIS, ROUTINE W REFLEX MICROSCOPIC
Glucose, UA: NEGATIVE mg/dL
Ketones, ur: 15 mg/dL — AB
Leukocytes,Ua: NEGATIVE
Nitrite: NEGATIVE
Protein, ur: 100 mg/dL — AB
Specific Gravity, Urine: >1.03 — ABNORMAL HIGH (ref 1.005–1.030)
pH: 5.5 (ref 5.0–8.0)

## 2020-08-21 LAB — LIPASE, BLOOD: Lipase: 28 U/L (ref 11–51)

## 2020-08-21 MED ORDER — OXYCODONE-ACETAMINOPHEN 5-325 MG PO TABS
1.0000 | ORAL_TABLET | Freq: Once | ORAL | Status: AC
  Administered 2020-08-21: 1 via ORAL
  Filled 2020-08-21: qty 1

## 2020-08-21 MED ORDER — LACTATED RINGERS IV BOLUS
1000.0000 mL | Freq: Once | INTRAVENOUS | Status: AC
  Administered 2020-08-21: 1000 mL via INTRAVENOUS

## 2020-08-21 MED ORDER — HYDROMORPHONE HCL 1 MG/ML IJ SOLN
1.0000 mg | Freq: Once | INTRAMUSCULAR | Status: AC
  Administered 2020-08-21: 1 mg via INTRAVENOUS
  Filled 2020-08-21: qty 1

## 2020-08-21 MED ORDER — KETOROLAC TROMETHAMINE 15 MG/ML IJ SOLN
15.0000 mg | Freq: Once | INTRAMUSCULAR | Status: AC
  Administered 2020-08-21: 15 mg via INTRAVENOUS
  Filled 2020-08-21: qty 1

## 2020-08-21 MED ORDER — LORAZEPAM 2 MG/ML IJ SOLN
0.5000 mg | Freq: Once | INTRAMUSCULAR | Status: AC
  Administered 2020-08-21: 0.5 mg via INTRAVENOUS
  Filled 2020-08-21: qty 1

## 2020-08-21 MED ORDER — HYDROMORPHONE HCL 1 MG/ML IJ SOLN
1.0000 mg | Freq: Once | INTRAMUSCULAR | Status: DC
Start: 2020-08-21 — End: 2020-08-21

## 2020-08-21 MED ORDER — ONDANSETRON HCL 4 MG/2ML IJ SOLN
4.0000 mg | Freq: Once | INTRAMUSCULAR | Status: AC
  Administered 2020-08-21: 4 mg via INTRAVENOUS
  Filled 2020-08-21: qty 2

## 2020-08-21 MED ORDER — OXYCODONE-ACETAMINOPHEN 5-325 MG PO TABS
2.0000 | ORAL_TABLET | Freq: Once | ORAL | Status: AC
  Administered 2020-08-21: 2 via ORAL
  Filled 2020-08-21: qty 2

## 2020-08-21 MED ORDER — HYDROMORPHONE HCL 1 MG/ML IJ SOLN
1.0000 mg | Freq: Once | INTRAMUSCULAR | Status: AC
Start: 2020-08-21 — End: 2020-08-21
  Administered 2020-08-21: 1 mg via INTRAVENOUS
  Filled 2020-08-21: qty 1

## 2020-08-21 MED ORDER — TAMSULOSIN HCL 0.4 MG PO CAPS: 0.4000 mg | ORAL_CAPSULE | Freq: Every day | ORAL | 0 refills | Status: AC

## 2020-08-21 MED ORDER — HYDROMORPHONE HCL 1 MG/ML IJ SOLN
0.5000 mg | Freq: Once | INTRAMUSCULAR | Status: AC
  Administered 2020-08-21: 0.5 mg via INTRAVENOUS
  Filled 2020-08-21: qty 1

## 2020-08-21 MED ORDER — OXYCODONE-ACETAMINOPHEN 5-325 MG PO TABS: 1.0000 | ORAL_TABLET | Freq: Four times a day (QID) | ORAL | 0 refills | Status: DC | PRN

## 2020-08-21 MED ORDER — ONDANSETRON 4 MG PO TBDP
4.0000 mg | ORAL_TABLET | Freq: Once | ORAL | Status: AC
  Administered 2020-08-21: 4 mg via ORAL
  Filled 2020-08-21: qty 1

## 2020-08-21 NOTE — ED Triage Notes (Signed)
Pt c/o possible kidney stone, hx of same. Endorses R lower back pain, nauseous, lightheaded, states it "feels just like/worse than last one." Pt also tested covid+ Monday, reports no issues since dx, vaccinated.

## 2020-08-21 NOTE — ED Provider Notes (Signed)
Emergency Medicine Provider Triage Evaluation Note  Matthew Grant , a 60 y.o. male  was evaluated in triage.  Pt complains of left flank and abd pain that started about 1 hour pta. Hx kidney stones and this feels the same.  Review of Systems  Positive: Flank and abe pain Negative: vomiting  Physical Exam  BP 122/68   Pulse 62   Temp 98.4 F (36.9 C) (Oral)   Resp (!) 26   SpO2 97%  Gen:   Awake, no distress   Resp:  Normal effort  MSK:   Moves extremities without difficulty  Other:    Medical Decision Making  Medically screening exam initiated at 4:17 PM.  Appropriate orders placed.  Matthew Grant. was informed that the remainder of the evaluation will be completed by another provider, this initial triage assessment does not replace that evaluation, and the importance of remaining in the ED until their evaluation is complete.     Rodney Booze, PA-C 08/21/20 1617    Lajean Saver, MD 08/22/20 1258
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
## 2020-08-21 NOTE — Discharge Instructions (Addendum)
You were seen in the ER today for your flank pain. You were found to have a kidney stone on the right side. You have been prescribed a medication to encourage you to urinate more regularly, as well as a narcotic pain medication to use as needed for severe pain.   Please return to the ER if you develop any new severe symptoms of pain, fever or vomiting or your develop difficulty urinating on your own.

## 2020-08-21 NOTE — ED Notes (Signed)
Patient transported to CT 

## 2020-08-21 NOTE — ED Provider Notes (Addendum)
UA withblood but no signs of infection.  CT with 2 renal stones on the right the largest 1 is 6cm.  Will patient return for symptoms he started having more pain.  He was given Toradol but after 30 minutes on repeat evaluation it was not helping with his pain.  He was given another dose of Dilaudid.  We will try to achieve pain control.  Hopefully patient will be able to go home on oral medications and Flomax.  9:37 PM Pain is now more tolerable with dilaudid.  Will give 2 oral percocet and recheck in 1 hour to ensure pain is still managable and will d/c home if so.  10:25 PM Pt still having pain.  Just received oral meds.  Will give 1 more dose of IV meds and then recheck  11:29 PM Pt now feeling better and will d/c home.   Blanchie Dessert, MD 08/21/20 2137    Blanchie Dessert, MD 08/21/20 2329

## 2020-08-22 ENCOUNTER — Encounter (HOSPITAL_COMMUNITY)
Admission: EM | Disposition: A | Discharge status: Home / Self Care | Payer: Self-pay | Source: Home / Self Care | Attending: Emergency Medicine

## 2020-08-22 ENCOUNTER — Emergency Department (HOSPITAL_COMMUNITY): Payer: 59

## 2020-08-22 ENCOUNTER — Other Ambulatory Visit: Payer: Self-pay

## 2020-08-22 ENCOUNTER — Encounter (HOSPITAL_COMMUNITY): Payer: Self-pay | Admitting: Emergency Medicine

## 2020-08-22 ENCOUNTER — Emergency Department (HOSPITAL_COMMUNITY): Payer: 59 | Admitting: Registered Nurse

## 2020-08-22 ENCOUNTER — Ambulatory Visit (HOSPITAL_COMMUNITY)
Admission: EM | Admit: 2020-08-22 | Discharge: 2020-08-22 | Disposition: A | Discharge status: Home / Self Care | Payer: 59 | Attending: Emergency Medicine | Admitting: Emergency Medicine

## 2020-08-22 DIAGNOSIS — Z8616 Personal history of COVID-19: Secondary | ICD-10-CM | POA: Diagnosis not present

## 2020-08-22 DIAGNOSIS — Z791 Long term (current) use of non-steroidal anti-inflammatories (NSAID): Secondary | ICD-10-CM | POA: Diagnosis not present

## 2020-08-22 DIAGNOSIS — I10 Essential (primary) hypertension: Secondary | ICD-10-CM | POA: Insufficient documentation

## 2020-08-22 DIAGNOSIS — Z91041 Radiographic dye allergy status: Secondary | ICD-10-CM | POA: Insufficient documentation

## 2020-08-22 DIAGNOSIS — Z79899 Other long term (current) drug therapy: Secondary | ICD-10-CM | POA: Diagnosis not present

## 2020-08-22 DIAGNOSIS — N201 Calculus of ureter: Secondary | ICD-10-CM | POA: Insufficient documentation

## 2020-08-22 DIAGNOSIS — Z87442 Personal history of urinary calculi: Secondary | ICD-10-CM | POA: Insufficient documentation

## 2020-08-22 DIAGNOSIS — N135 Crossing vessel and stricture of ureter without hydronephrosis: Secondary | ICD-10-CM | POA: Diagnosis not present

## 2020-08-22 DIAGNOSIS — N200 Calculus of kidney: Secondary | ICD-10-CM

## 2020-08-22 HISTORY — PX: CYSTOSCOPY/URETEROSCOPY/HOLMIUM LASER/STENT PLACEMENT: SHX6546

## 2020-08-22 LAB — CBC WITH DIFFERENTIAL/PLATELET
Abs Immature Granulocytes: 0.04 10*3/uL (ref 0.00–0.07)
Basophils Absolute: 0 10*3/uL (ref 0.0–0.1)
Basophils Relative: 0 %
Eosinophils Absolute: 0 10*3/uL (ref 0.0–0.5)
Eosinophils Relative: 0 %
HCT: 39.5 % (ref 39.0–52.0)
Hemoglobin: 13.1 g/dL (ref 13.0–17.0)
Immature Granulocytes: 0 %
Lymphocytes Relative: 10 %
Lymphs Abs: 1.1 10*3/uL (ref 0.7–4.0)
MCH: 28.9 pg (ref 26.0–34.0)
MCHC: 33.2 g/dL (ref 30.0–36.0)
MCV: 87.2 fL (ref 80.0–100.0)
Monocytes Absolute: 0.4 10*3/uL (ref 0.1–1.0)
Monocytes Relative: 4 %
Neutro Abs: 9.2 10*3/uL — ABNORMAL HIGH (ref 1.7–7.7)
Neutrophils Relative %: 86 %
Platelets: 208 10*3/uL (ref 150–400)
RBC: 4.53 MIL/uL (ref 4.22–5.81)
RDW: 13.3 % (ref 11.5–15.5)
WBC: 10.8 10*3/uL — ABNORMAL HIGH (ref 4.0–10.5)
nRBC: 0 % (ref 0.0–0.2)

## 2020-08-22 LAB — COMPREHENSIVE METABOLIC PANEL
ALT: 27 U/L (ref 0–44)
AST: 19 U/L (ref 15–41)
Albumin: 4 g/dL (ref 3.5–5.0)
Alkaline Phosphatase: 53 U/L (ref 38–126)
Anion gap: 11 (ref 5–15)
BUN: 26 mg/dL — ABNORMAL HIGH (ref 6–20)
CO2: 25 mmol/L (ref 22–32)
Calcium: 8.8 mg/dL — ABNORMAL LOW (ref 8.9–10.3)
Chloride: 105 mmol/L (ref 98–111)
Creatinine, Ser: 1.48 mg/dL — ABNORMAL HIGH (ref 0.61–1.24)
GFR, Estimated: 54 mL/min — ABNORMAL LOW (ref >60)
Glucose, Bld: 138 mg/dL — ABNORMAL HIGH (ref 70–99)
Potassium: 3.9 mmol/L (ref 3.5–5.1)
Sodium: 141 mmol/L (ref 135–145)
Total Bilirubin: 1 mg/dL (ref 0.3–1.2)
Total Protein: 6.6 g/dL (ref 6.5–8.1)

## 2020-08-22 SURGERY — CYSTOSCOPY/URETEROSCOPY/HOLMIUM LASER/STENT PLACEMENT
Anesthesia: General | Site: Ureter | Laterality: Right

## 2020-08-22 MED ORDER — FENTANYL CITRATE (PF) 100 MCG/2ML IJ SOLN: 25.0000 ug | INTRAMUSCULAR | Status: DC | PRN

## 2020-08-22 MED ORDER — ONDANSETRON HCL 4 MG/2ML IJ SOLN
INTRAMUSCULAR | Status: AC
  Filled 2020-08-22: qty 2

## 2020-08-22 MED ORDER — OXYCODONE HCL 5 MG PO TABS: 5.0000 mg | ORAL_TABLET | Freq: Once | ORAL | Status: DC | PRN

## 2020-08-22 MED ORDER — PHENYLEPHRINE 40 MCG/ML (10ML) SYRINGE FOR IV PUSH (FOR BLOOD PRESSURE SUPPORT)
PREFILLED_SYRINGE | INTRAVENOUS | Status: AC
  Filled 2020-08-22: qty 10

## 2020-08-22 MED ORDER — PROPOFOL 10 MG/ML IV BOLUS
INTRAVENOUS | Status: AC
  Filled 2020-08-22: qty 20

## 2020-08-22 MED ORDER — ONDANSETRON HCL 4 MG/2ML IJ SOLN
INTRAMUSCULAR | Status: DC | PRN
  Administered 2020-08-22: 4 mg via INTRAVENOUS

## 2020-08-22 MED ORDER — DEXAMETHASONE SODIUM PHOSPHATE 10 MG/ML IJ SOLN
INTRAMUSCULAR | Status: DC | PRN
  Administered 2020-08-22: 10 mg via INTRAVENOUS

## 2020-08-22 MED ORDER — BELLADONNA ALKALOIDS-OPIUM 16.2-30 MG RE SUPP
RECTAL | Status: AC
  Filled 2020-08-22: qty 1

## 2020-08-22 MED ORDER — SODIUM CHLORIDE 0.9 % IV SOLN
INTRAVENOUS | Status: AC
  Filled 2020-08-22: qty 2

## 2020-08-22 MED ORDER — SUCCINYLCHOLINE CHLORIDE 200 MG/10ML IV SOSY
PREFILLED_SYRINGE | INTRAVENOUS | Status: AC
  Filled 2020-08-22: qty 10

## 2020-08-22 MED ORDER — BELLADONNA ALKALOIDS-OPIUM 16.2-60 MG RE SUPP
RECTAL | Status: DC | PRN
  Administered 2020-08-22: 1 via RECTAL

## 2020-08-22 MED ORDER — ONDANSETRON HCL 4 MG PO TABS: 4.0000 mg | ORAL_TABLET | Freq: Every day | ORAL | 1 refills | Status: DC | PRN

## 2020-08-22 MED ORDER — SODIUM CHLORIDE 0.9 % IV BOLUS
1000.0000 mL | Freq: Once | INTRAVENOUS | Status: AC
  Administered 2020-08-22: 1000 mL via INTRAVENOUS

## 2020-08-22 MED ORDER — ONDANSETRON HCL 4 MG/2ML IJ SOLN: 4.0000 mg | Freq: Four times a day (QID) | INTRAMUSCULAR | Status: DC | PRN

## 2020-08-22 MED ORDER — HYDROMORPHONE HCL 1 MG/ML IJ SOLN: 1.0000 mg | INTRAMUSCULAR | Status: DC | PRN

## 2020-08-22 MED ORDER — LIDOCAINE 2% (20 MG/ML) 5 ML SYRINGE
INTRAMUSCULAR | Status: AC
  Filled 2020-08-22: qty 5

## 2020-08-22 MED ORDER — PHENAZOPYRIDINE HCL 200 MG PO TABS: 200.0000 mg | ORAL_TABLET | Freq: Three times a day (TID) | ORAL | 0 refills | Status: DC | PRN

## 2020-08-22 MED ORDER — HYDROMORPHONE HCL 1 MG/ML IJ SOLN
1.0000 mg | Freq: Once | INTRAMUSCULAR | Status: AC
  Administered 2020-08-22: 1 mg via INTRAVENOUS
  Filled 2020-08-22: qty 1

## 2020-08-22 MED ORDER — MIDAZOLAM HCL 5 MG/5ML IJ SOLN
INTRAMUSCULAR | Status: DC | PRN
  Administered 2020-08-22: 2 mg via INTRAVENOUS

## 2020-08-22 MED ORDER — SODIUM CHLORIDE 0.9 % IV SOLN: INTRAVENOUS | Status: DC

## 2020-08-22 MED ORDER — PROPOFOL 10 MG/ML IV BOLUS
INTRAVENOUS | Status: DC | PRN
  Administered 2020-08-22: 200 mg via INTRAVENOUS
  Administered 2020-08-22: 40 mg via INTRAVENOUS

## 2020-08-22 MED ORDER — MIDAZOLAM HCL 2 MG/2ML IJ SOLN
INTRAMUSCULAR | Status: AC
  Filled 2020-08-22: qty 2

## 2020-08-22 MED ORDER — SODIUM CHLORIDE 0.9% FLUSH: 3.0000 mL | Freq: Two times a day (BID) | INTRAVENOUS | Status: DC

## 2020-08-22 MED ORDER — FENTANYL CITRATE (PF) 250 MCG/5ML IJ SOLN
INTRAMUSCULAR | Status: AC
  Filled 2020-08-22: qty 5

## 2020-08-22 MED ORDER — PHENYLEPHRINE HCL (PRESSORS) 10 MG/ML IV SOLN
INTRAVENOUS | Status: DC | PRN
  Administered 2020-08-22: 80 ug via INTRAVENOUS

## 2020-08-22 MED ORDER — SODIUM CHLORIDE 0.9 % IV SOLN: 2.0000 g | INTRAVENOUS | Status: DC

## 2020-08-22 MED ORDER — OXYCODONE HCL 5 MG/5ML PO SOLN
5.0000 mg | Freq: Once | ORAL | Status: DC | PRN
Start: 2020-08-22 — End: 2020-08-23

## 2020-08-22 MED ORDER — LACTATED RINGERS IV SOLN: INTRAVENOUS | Status: DC | PRN

## 2020-08-22 MED ORDER — SODIUM CHLORIDE 0.9 % IR SOLN
Status: DC | PRN
  Administered 2020-08-22: 3000 mL via INTRAVESICAL

## 2020-08-22 MED ORDER — LIDOCAINE 2% (20 MG/ML) 5 ML SYRINGE
INTRAMUSCULAR | Status: DC | PRN
  Administered 2020-08-22: 50 mg via INTRAVENOUS

## 2020-08-22 MED ORDER — KETOROLAC TROMETHAMINE 15 MG/ML IJ SOLN
30.0000 mg | Freq: Once | INTRAMUSCULAR | Status: AC
  Administered 2020-08-22: 30 mg via INTRAVENOUS
  Filled 2020-08-22: qty 2

## 2020-08-22 MED ORDER — EPHEDRINE 5 MG/ML INJ
INTRAVENOUS | Status: AC
  Filled 2020-08-22: qty 5

## 2020-08-22 MED ORDER — FENTANYL CITRATE (PF) 100 MCG/2ML IJ SOLN
INTRAMUSCULAR | Status: DC | PRN
  Administered 2020-08-22: 100 ug via INTRAVENOUS
  Administered 2020-08-22: 50 ug via INTRAVENOUS

## 2020-08-22 MED ORDER — DEXAMETHASONE SODIUM PHOSPHATE 10 MG/ML IJ SOLN
INTRAMUSCULAR | Status: AC
  Filled 2020-08-22: qty 1

## 2020-08-22 MED ORDER — EPHEDRINE SULFATE 50 MG/ML IJ SOLN
INTRAMUSCULAR | Status: DC | PRN
  Administered 2020-08-22: 5 mg via INTRAVENOUS

## 2020-08-22 MED ORDER — ONDANSETRON HCL 4 MG/2ML IJ SOLN
4.0000 mg | Freq: Once | INTRAMUSCULAR | Status: AC
  Administered 2020-08-22: 4 mg via INTRAVENOUS
  Filled 2020-08-22: qty 2

## 2020-08-22 MED ORDER — HYDROMORPHONE HCL 1 MG/ML IJ SOLN
1.0000 mg | INTRAMUSCULAR | Status: DC | PRN
Start: 2020-08-22 — End: 2020-08-22

## 2020-08-22 SURGICAL SUPPLY — 23 items
BAG URO CATCHER STRL LF (MISCELLANEOUS) ×2 IMPLANT
BASKET STONE NCOMPASS (UROLOGICAL SUPPLIES) IMPLANT
CATH URET 5FR 28IN OPEN ENDED (CATHETERS) IMPLANT
CATH URET DUAL LUMEN 6-10FR 50 (CATHETERS) ×1 IMPLANT
CLOTH BEACON ORANGE TIMEOUT ST (SAFETY) ×2 IMPLANT
EXTRACTOR STONE NITINOL NGAGE (UROLOGICAL SUPPLIES) IMPLANT
GLOVE SURG POLYISO LF SZ8 (GLOVE) ×2 IMPLANT
GOWN STRL REUS W/TWL XL LVL3 (GOWN DISPOSABLE) ×2 IMPLANT
GUIDEWIRE STR DUAL SENSOR (WIRE) ×2 IMPLANT
IV NS IRRIG 3000ML ARTHROMATIC (IV SOLUTION) ×2 IMPLANT
KIT TURNOVER KIT A (KITS) ×2 IMPLANT
LASER FIB FLEXIVA PULSE ID 365 (Laser) IMPLANT
LASER FIB FLEXIVA PULSE ID 550 (Laser) IMPLANT
LASER FIB FLEXIVA PULSE ID 910 (Laser) IMPLANT
MANIFOLD NEPTUNE II (INSTRUMENTS) ×2 IMPLANT
PACK CYSTO (CUSTOM PROCEDURE TRAY) ×2 IMPLANT
SHEATH URETERAL 12FRX35CM (MISCELLANEOUS) IMPLANT
SHEATH URETERAL 12FRX55CM (UROLOGICAL SUPPLIES) ×1 IMPLANT
STENT URET 6FRX28 CONTOUR (STENTS) ×1 IMPLANT
TRACTIP FLEXIVA PULS ID 200XHI (Laser) IMPLANT
TRACTIP FLEXIVA PULSE ID 200 (Laser) ×2
TUBING CONNECTING 10 (TUBING) ×2 IMPLANT
TUBING UROLOGY SET (TUBING) ×2 IMPLANT
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
## 2020-08-22 NOTE — ED Triage Notes (Signed)
Patient has confirmed kidney stone, urology would like him back for sx consult. Nausea and emesis. Was Covid + 1 week and 1 day ago.

## 2020-08-22 NOTE — Op Note (Signed)
Procedure: 1.  Cystoscopy with dilation of right ureteral stricture. 2.  Right ureteroscopy with holmium laser application and insertion of right double-J stent. 3.  Application of fluoroscopy.  Preop diagnosis: 6 x 8 mm right proximal ureteral stone.  Postop note diagnosis: Same with mid ureteral stricture.  Surgeon: Dr. Irine Seal.  Anesthesia: General.  Specimen: Stone crystals from the bladder for analysis.  Drain: 6 [place] by 28 cm right contour double-J stent.  EBL: None.  Complications: None.  Indications: The patient is a 60 year old male with a history of stones who presented to the emergency room yesterday with severe right flank pain.  He was found to have a 6 mm right proximal ureteral stone with a smaller stone proximally and his pain was initially managed medically.  He returned to the ER today after calling me with exacerbation of his pain and after reviewing the options he is elected to undergo ureteroscopy.  Procedure: He was given 2 g of Ancef.  A general anesthetic was induced.  He was placed in lithotomy position and fitted with PAS hose.  His perineum and genitalia were prepped with Betadine solution he was draped in usual sterile fashion.  Cystoscopy was performed using a 21 [place] scope and 30 degree lens.  Examination revealed a normal urethra.  The external sphincter was intact.  The prostatic urethra was short with mild lateral lobe hyperplasia and a slightly elevated median bar.  The bladder had mild trabeculation without mucosal lesions.  Ureteral orifices were in the normal anatomic position.  There was a collection of fine slightly amber crystals at the base the bladder and some of these were evacuated and sent for analysis.  A 6 French open-ended catheter was then passed through the cystoscope and advanced into the distal ureter.  A retrograde pyelogram was not performed because of his contrast allergy.  A sensor wire was advanced to the kidney under  fluoroscopic guidance and then the ureteral catheter was advanced to the kidney without significant difficulty over the wire.  The ureteral catheter was then removed along with the cystoscope leaving the wire in place.  An initial attempt was made to pass the 12 [place] inner core over 54 cm digital access sheath and I was able to advance it to the level of the mid ureter but no further.  I then attempted to pass the single-lumen digital flexible ureteroscope over the wire but was unable to get it by the mid ureter.  There appeared to be some evidence of stricture.  The ureteroscope was removed and an 8 [place] dual-lumen catheter was passed over the wire and while there was some resistance at the level of the mid ureter I was able to advance it into the more proximal ureter.  The 8 French catheter was removed and a second attempt was made with the 12 [place] inner core which I was then able to advance into the more proximal ureter but I did not feel I could pass the sheath.  The ureteroscope was then reinserted over the wire and I was able to negotiated into the renal collecting system.  The wire was removed and some bloody urine was aspirated from the collecting system to aid visualization.  I was then able to identify the stone which had been dislodged into an upper pole calyx.  The stone was then fragmented using a 200 m tract tip laser fiber with the settings of 0.3 J and 60 Hz on the left pedal and 0.8 J and  15 Hz on the right pedal.  The stone was broken into very small fragments with the largest approximately 2 mm but I did not feel I could retrieve any of the fragments due to the difficult access and the lack of the sheath.  Once final inspection revealed adequate fragmentation, a sensor wire was advanced back through the ureteroscope and the scope was removed under direct visualization of the ureter.  No significant ureteral tears were noted.  The cystoscope was then reinserted over the wire and a  6 [place] by 28 cm contour double-J stent was advanced to the kidney under fluoroscopic guidance.  The wire was removed, leaving a good coil in the kidney and a good coil in the bladder.  The patient is 6 foot 4 inches tall and had a very long ureter so the 28 stent was indeed necessary to span the ureter appropriately.  The bladder was then drained and the cystoscope was removed.  A BNO suppository was placed.  He was taken down from lithotomy position, his anesthetic was reversed and he was moved recovery room in stable condition.  There were no complications.

## 2020-08-22 NOTE — ED Notes (Signed)
Patient report given to Crosby, RN in PACU. Patient to be taken PACU at 1600.

## 2020-08-22 NOTE — Progress Notes (Signed)
O2 sat 88% with rOOM AIR. Is GIVEN AND TEACHED. 10X UP TO 1250ML. TAUGHT 10X PER HR WHILE AWAKE. TOLERATED WELL.

## 2020-08-22 NOTE — Anesthesia Preprocedure Evaluation (Signed)
Anesthesia Evaluation  Patient identified by MRN, date of birth, ID band Patient awake    Reviewed: Allergy & Precautions, H&P , NPO status , Patient's Chart, lab work & pertinent test results  Airway Mallampati: II   Neck ROM: full    Dental   Pulmonary sleep apnea ,    breath sounds clear to auscultation       Cardiovascular hypertension,  Rhythm:regular Rate:Normal     Neuro/Psych PSYCHIATRIC DISORDERS Anxiety Depression    GI/Hepatic   Endo/Other    Renal/GU ARFRenal diseasestones     Musculoskeletal  (+) Arthritis ,   Abdominal   Peds  Hematology   Anesthesia Other Findings   Reproductive/Obstetrics                             Anesthesia Physical Anesthesia Plan  ASA: 2 and emergent  Anesthesia Plan: General   Post-op Pain Management:    Induction: Intravenous and Rapid sequence  PONV Risk Score and Plan: 2 and Ondansetron, Dexamethasone, Midazolam and Treatment may vary due to age or medical condition  Airway Management Planned: Oral ETT  Additional Equipment:   Intra-op Plan:   Post-operative Plan: Extubation in OR  Informed Consent: I have reviewed the patients History and Physical, chart, labs and discussed the procedure including the risks, benefits and alternatives for the proposed anesthesia with the patient or authorized representative who has indicated his/her understanding and acceptance.     Dental advisory given  Plan Discussed with: CRNA, Anesthesiologist and Surgeon  Anesthesia Plan Comments:         Anesthesia Quick Evaluation

## 2020-08-22 NOTE — ED Provider Notes (Signed)
Emergency Medicine Provider Triage Evaluation Note  Matthew Grant , a 60 y.o. male  was evaluated in triage.  Pt complains of right flank pain.  Patient was evaluated in the emergency department yesterday and diagnosed with a right 6 mm proximal ureteral stone.  He was discharged home with analgesics.  States that his pain continued to worsen overnight.  Reports pain in the right low back with associated nausea, vomiting, hematuria.  Denies any difficulty urinating.  Of note, patient was diagnosed with COVID-19 8 days ago.  He states he has had no symptoms related to COVID-19 for the past 4 days.  He has been vaccinated for COVID-19 x3.  Physical Exam  BP 132/82 (BP Location: Left Arm)   Pulse (!) 58   Temp 98.5 F (36.9 C) (Oral)   Resp 18   SpO2 93%  Gen:   Awake, no distress   Resp:  Normal effort  MSK:   Moves extremities without difficulty  Other:    Medical Decision Making  Medically screening exam initiated at 1:14 PM.  Appropriate orders placed.  Matthew Grant. was informed that the remainder of the evaluation will be completed by another provider, this initial triage assessment does not replace that evaluation, and the importance of remaining in the ED until their evaluation is complete.   Rayna Sexton, PA-C 08/22/20 1315    Merryl Hacker, MD 08/23/20 [phone]

## 2020-08-22 NOTE — Anesthesia Procedure Notes (Addendum)
Procedure Name: Intubation Date/Time: 08/22/2020 5:39 PM Performed by: Lissa Morales, CRNA Pre-anesthesia Checklist: Patient identified, Emergency Drugs available, Suction available and Patient being monitored Patient Re-evaluated:Patient Re-evaluated prior to induction Oxygen Delivery Method: Circle system utilized Preoxygenation: Pre-oxygenation with 100% oxygen Induction Type: IV induction Ventilation: Mask ventilation without difficulty Laryngoscope Size: Mac and 4 Tube type: Oral Tube size: 8.0 mm Number of attempts: 2 Airway Equipment and Method: Stylet and Oral airway Placement Confirmation: ETT inserted through vocal cords under direct vision, positive ETCO2 and breath sounds checked- equal and bilateral Secured at: 22 cm Tube secured with: Tape Dental Injury: Teeth and Oropharynx as per pre-operative assessment  Comments: Larynx anterior bur able to visualize well on second attempt
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
## 2020-08-22 NOTE — Transfer of Care (Signed)
Immediate Anesthesia Transfer of Care Note  Patient: Matthew Grant.  Procedure(s) Performed: CYSTOSCOPY/URETEROSCOPY/HOLMIUM LASER/STENT PLACEMENT (Right: Ureter)  Patient Location: PACU  Anesthesia Type:General  Level of Consciousness: awake, alert , oriented and patient cooperative  Airway & Oxygen Therapy: Patient Spontanous Breathing and Patient connected to face mask oxygen  Post-op Assessment: Report given to RN, Post -op Vital signs reviewed and stable and Patient moving all extremities X 4  Post vital signs: stable  Last Vitals:  Vitals Value Taken Time  BP 121/77 08/22/20 1855  Temp    Pulse 89 08/22/20 1857  Resp 19 08/22/20 1857  SpO2 95 % 08/22/20 1857  Vitals shown include unvalidated device data.  Last Pain:  Vitals:   08/22/20 1407  TempSrc:   PainSc: 10-Worst pain ever         Complications: No notable events documented.

## 2020-08-24 ENCOUNTER — Encounter (HOSPITAL_COMMUNITY): Payer: Self-pay | Admitting: Urology

## 2020-08-25 NOTE — Anesthesia Postprocedure Evaluation (Signed)
Anesthesia Post Note  Patient: Matthew Grant.  Procedure(s) Performed: CYSTOSCOPY/URETEROSCOPY/HOLMIUM LASER/STENT PLACEMENT (Right: Ureter)     Patient location during evaluation: PACU Anesthesia Type: General Level of consciousness: awake and alert Pain management: pain level controlled Vital Signs Assessment: post-procedure vital signs reviewed and stable Respiratory status: spontaneous breathing, nonlabored ventilation, respiratory function stable and patient connected to nasal cannula oxygen Cardiovascular status: blood pressure returned to baseline and stable Postop Assessment: no apparent nausea or vomiting Anesthetic complications: no   No notable events documented.  Last Vitals:  Vitals:   08/22/20 1915 08/22/20 1934  BP: 123/75 132/77  Pulse: 82 74  Resp: (!) 27 18  Temp: 36.8 C 36.7 C  SpO2: 93% 91%    Last Pain:  Vitals:   08/22/20 1934  TempSrc:   PainSc: 0-No pain                 Oleg Oleson S

## 2020-08-29 LAB — CALCULI, WITH PHOTOGRAPH (CLINICAL LAB)
Calcium Oxalate Dihydrate: 20 %
Uric Acid Calculi: 80 %
Weight Calculi: 13 mg

## 2020-08-31 ENCOUNTER — Encounter: Payer: Self-pay | Admitting: Internal Medicine

## 2020-08-31 ENCOUNTER — Other Ambulatory Visit: Payer: Self-pay

## 2020-08-31 ENCOUNTER — Ambulatory Visit (INDEPENDENT_AMBULATORY_CARE_PROVIDER_SITE_OTHER): Payer: 59 | Admitting: Internal Medicine

## 2020-08-31 VITALS — BP 138/86 | HR 57 | Temp 97.6°F | Ht 76.0 in | Wt 271.0 lb

## 2020-08-31 DIAGNOSIS — I10 Essential (primary) hypertension: Secondary | ICD-10-CM | POA: Diagnosis not present

## 2020-08-31 DIAGNOSIS — R310 Gross hematuria: Secondary | ICD-10-CM | POA: Diagnosis not present

## 2020-08-31 DIAGNOSIS — R300 Dysuria: Secondary | ICD-10-CM | POA: Insufficient documentation

## 2020-08-31 LAB — URINALYSIS, ROUTINE W REFLEX MICROSCOPIC
Bilirubin Urine: NEGATIVE
Ketones, ur: NEGATIVE
Nitrite: NEGATIVE
Specific Gravity, Urine: 1.02 (ref 1.000–1.030)
Total Protein, Urine: 100 — AB
Urine Glucose: NEGATIVE
Urobilinogen, UA: 1 (ref 0.0–1.0)
pH: 7.5 (ref 5.0–8.0)

## 2020-08-31 NOTE — Progress Notes (Signed)
Subjective:  Patient ID: Matthew Grant., male    DOB: 11/14/1960  Age: 60 y.o. MRN: [id number]  CC: Follow-up  This visit occurred during the SARS-CoV-2 public health emergency.  Safety protocols were in place, including screening questions prior to the visit, additional usage of staff PPE, and extensive cleaning of exam room while observing appropriate contact time as indicated for disinfecting solutions.    HPI Matthew Grant. presents for f/up -  He was recently admitted for obstructive uropathy due to nephrolithiasis.  He has undergone stenting and tells me that his abdominal pain has resolved.  He continues to complain of dysuria and hematuria.  Outpatient Medications Prior to Visit  Medication Sig Dispense Refill   meloxicam (MOBIC) 7.5 MG tablet TAKE 1 TABLET(7.5 MG) BY MOUTH DAILY (Patient taking differently: Take 7.5 mg by mouth daily.) 90 tablet 1   metoprolol succinate (TOPROL-XL) 50 MG 24 hr tablet Take 1 tablet (50 mg total) by mouth daily. 90 tablet 7   ondansetron (ZOFRAN) 4 MG tablet Take 1 tablet (4 mg total) by mouth daily as needed for nausea or vomiting. 30 tablet 1   oxyCODONE-acetaminophen (PERCOCET/ROXICET) 5-325 MG tablet Take 1 tablet by mouth every 6 (six) hours as needed for severe pain. 15 tablet 0   tamsulosin (FLOMAX) 0.4 MG CAPS capsule Take 1 capsule (0.4 mg total) by mouth daily. 30 capsule 0   telmisartan (MICARDIS) 40 MG tablet TAKE 1 TABLET BY MOUTH DAILY (Patient taking differently: Take 40 mg by mouth daily.) 90 tablet 1   molnupiravir EUA 200 mg CAPS Take 4 capsules by mouth 2 (two) times daily.     phenazopyridine (PYRIDIUM) 200 MG tablet Take 1 tablet (200 mg total) by mouth 3 (three) times daily as needed for pain. 10 tablet 0   No facility-administered medications prior to visit.    ROS Review of Systems  Constitutional:  Negative for diaphoresis and fatigue.  HENT: Negative.    Eyes: Negative.   Respiratory:  Negative for cough  and shortness of breath.   Cardiovascular:  Negative for chest pain, palpitations and leg swelling.  Gastrointestinal:  Negative for abdominal pain, diarrhea, nausea and vomiting.  Endocrine: Negative.   Genitourinary:  Positive for dysuria and hematuria. Negative for difficulty urinating, flank pain and urgency.  Musculoskeletal: Negative.  Negative for back pain and myalgias.  Skin: Negative.   Neurological:  Negative for dizziness, weakness, light-headedness and headaches.  Hematological:  Negative for adenopathy. Does not bruise/bleed easily.  Psychiatric/Behavioral: Negative.     Objective:  BP 138/86 (BP Location: Right Arm, Patient Position: Sitting, Cuff Size: Large)   Pulse (!) 57   Temp 97.6 F (36.4 C) (Oral)   Ht '6\' 4"'$  (1.93 m)   Wt 271 lb (122.9 kg)   SpO2 96%   BMI 32.99 kg/m   BP Readings from Last 3 Encounters:  08/31/20 138/86  08/22/20 132/77  08/21/20 114/76    Wt Readings from Last 3 Encounters:  08/31/20 271 lb (122.9 kg)  08/21/20 274 lb (124.3 kg)  04/10/20 278 lb (126.1 kg)    Physical Exam Vitals reviewed.  Constitutional:      Appearance: Normal appearance.  HENT:     Nose: Nose normal.     Mouth/Throat:     Mouth: Mucous membranes are moist.  Eyes:     Conjunctiva/sclera: Conjunctivae normal.  Cardiovascular:     Rate and Rhythm: Normal rate and regular rhythm.     Heart  sounds: No murmur heard. Pulmonary:     Effort: Pulmonary effort is normal.     Breath sounds: No stridor. No wheezing, rhonchi or rales.  Abdominal:     General: Abdomen is flat. Bowel sounds are normal. There is no distension.     Palpations: Abdomen is soft. There is no fluid wave, hepatomegaly, splenomegaly or mass.  Musculoskeletal:        General: Normal range of motion.     Cervical back: Neck supple.     Right lower leg: No edema.     Left lower leg: No edema.  Lymphadenopathy:     Cervical: No cervical adenopathy.  Skin:    General: Skin is warm and dry.   Neurological:     General: No focal deficit present.     Mental Status: He is alert.    Lab Results  Component Value Date   WBC 10.8 (H) 08/22/2020   HGB 13.1 08/22/2020   HCT 39.5 08/22/2020   PLT 208 08/22/2020   GLUCOSE 138 (H) 08/22/2020   CHOL 188 02/06/2020   TRIG 148.0 02/06/2020   HDL 41.50 02/06/2020   LDLCALC 117 (H) 02/06/2020   ALT 27 08/22/2020   AST 19 08/22/2020   NA 141 08/22/2020   K 3.9 08/22/2020   CL 105 08/22/2020   CREATININE 1.48 (H) 08/22/2020   BUN 26 (H) 08/22/2020   CO2 25 08/22/2020   TSH 1.43 02/06/2020   PSA 1.25 02/06/2020   HGBA1C 5.4 05/22/2017    DG Abd 1 View  Result Date: 08/22/2020 CLINICAL DATA:  Confirm kidney stone.  Surgical consultation. EXAM: ABDOMEN - 1 VIEW COMPARISON:  CT scan August 21, 2020 FINDINGS: The known proximal right ureteral stone measuring 6 mm on recent CT imaging is seen in the right abdomen at the L2-3 level. The more proximal punctate stone is not clearly visualized. No other renal or ureteral stones noted. IMPRESSION: The known 6 mm proximal right ureteral stone is seen at the L2-3 level, just lateral to the transverse processes. The more proximal punctate stone seen on CT imaging is not clearly visualized. Electronically Signed   By: Matthew Grant M.D   On: 08/22/2020 15:06   DG C-Arm 1-60 Min-No Report  Result Date: 08/22/2020 Fluoroscopy was utilized by the requesting physician.  No radiographic interpretation.    Assessment & Plan:   Matthew Grant was seen today for follow-up.  Diagnoses and all orders for this visit:  Dysuria- His urine culture is negative.  Will hold off on antibiotics for now. -     Urinalysis, Routine w reflex microscopic; Future -     CULTURE, URINE COMPREHENSIVE; Future -     CULTURE, URINE COMPREHENSIVE -     Urinalysis, Routine w reflex microscopic  Gross hematuria- He will continue to follow-up with urology. -     Urinalysis, Routine w reflex microscopic; Future -      CULTURE, URINE COMPREHENSIVE; Future -     CULTURE, URINE COMPREHENSIVE -     Urinalysis, Routine w reflex microscopic  Primary hypertension- His blood pressure is adequately well controlled.  I have discontinued Matthew Grant. Matthew Grant molnupiravir EUA and phenazopyridine. I am also having him maintain his metoprolol succinate, meloxicam, telmisartan, tamsulosin, oxyCODONE-acetaminophen, and ondansetron.  No orders of the defined types were placed in this encounter.    Follow-up: No follow-ups on file.  Scarlette Calico, MD

## 2020-09-02 LAB — CULTURE, URINE COMPREHENSIVE: RESULT:: NO GROWTH

## 2020-09-04 ENCOUNTER — Encounter: Payer: Self-pay | Admitting: Internal Medicine

## 2020-09-11 ENCOUNTER — Other Ambulatory Visit: Payer: Self-pay | Admitting: Internal Medicine

## 2020-10-12 ENCOUNTER — Other Ambulatory Visit: Payer: Self-pay | Admitting: Cardiology

## 2021-02-05 ENCOUNTER — Other Ambulatory Visit: Payer: Self-pay | Admitting: Internal Medicine

## 2021-02-16 ENCOUNTER — Other Ambulatory Visit: Payer: Self-pay | Admitting: *Deleted

## 2021-02-16 MED ORDER — METOPROLOL SUCCINATE ER 50 MG PO TB24: ORAL_TABLET | ORAL | 1 refills | Status: DC

## 2021-03-16 ENCOUNTER — Encounter: Payer: Self-pay | Admitting: Cardiology

## 2021-03-18 ENCOUNTER — Telehealth: Payer: Self-pay | Admitting: *Deleted

## 2021-03-18 DIAGNOSIS — I10 Essential (primary) hypertension: Secondary | ICD-10-CM

## 2021-03-18 DIAGNOSIS — I77810 Thoracic aortic ectasia: Secondary | ICD-10-CM

## 2021-03-18 NOTE — Telephone Encounter (Signed)
Good afternoon,  I have had Dr Marlou Porch to review this.  He would like for you to have a 2 D Echo and this can be  scheduled for the same day as your appointment.  I will place the order for the echo and have someone contact you to schedule.    Thank you,  Pam, RN        From:Matthew Grant. "Bucky"      Sent:03/17/2021 11:54 AM EST        CL:EXNTZGY Medical Advice Request Message List   Subject:Appointment  Mondays are best. We can drive up on a Sunday. If he wants a Electrocardiogram it will have to be done the same day.  Monday March 6      "           March 20      "           April 3 If the appointment is possible on one of these days.  Thank you.  [name] Afternoon,   Dr Marlou Porch is actually not working in the office at all that week.  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  Is there another time you will be coming to [facility]?   Thank you   Pam, RN      ----- Message -----      From:Matthew Melvern Sample. "Bucky"      Sent:03/16/2021  1:50 PM EST        FV:CBSW Marlou Porch, MD   Subject:Appointment  Hello Dr. Marlou Porch: I recently received a letter stating it was time to schedule an appointment for my follow up. We moved to [facility] [place] in December 2022. It has been a very stressful time but we have made it through this re-location. When my wife called to schedule my appointment today she was told I could not get in to the [place] office until May.  I have had a few heart palpations and was hoping to get an appointment earlier. I will be in [place]  on Monday April 17th at 8:40 am to see my primary Doctor Scarlette Calico. She was told you were not in that day but we could possible stay until Tuesday April 18th. I prefer to come see you since you know my heart condition. If anything can be worked out I sure would appreciate it very much. Thanks Dr. Marlou Porch. Look forward to hearing from you.  Matthew Grant 2061-01-01 DOB

## 2021-04-09 ENCOUNTER — Ambulatory Visit: Payer: BLUE CROSS/BLUE SHIELD | Admitting: Nurse Practitioner

## 2021-04-09 ENCOUNTER — Other Ambulatory Visit: Payer: Self-pay

## 2021-04-09 ENCOUNTER — Ambulatory Visit (HOSPITAL_COMMUNITY): Payer: BLUE CROSS/BLUE SHIELD | Attending: Cardiology

## 2021-04-09 ENCOUNTER — Encounter: Payer: Self-pay | Admitting: Nurse Practitioner

## 2021-04-09 VITALS — BP 118/78 | HR 51 | Ht 76.0 in | Wt 271.0 lb

## 2021-04-09 DIAGNOSIS — I471 Supraventricular tachycardia: Secondary | ICD-10-CM

## 2021-04-09 DIAGNOSIS — I77810 Thoracic aortic ectasia: Secondary | ICD-10-CM

## 2021-04-09 DIAGNOSIS — I10 Essential (primary) hypertension: Secondary | ICD-10-CM | POA: Diagnosis not present

## 2021-04-09 LAB — ECHOCARDIOGRAM COMPLETE
AR max vel: 2.16 cm2
AV Area VTI: 2.21 cm2
AV Area mean vel: 2.25 cm2
AV Mean grad: 10 mmHg
AV Peak grad: 18.7 mmHg
Ao pk vel: 2.16 m/s
Area-P 1/2: 2.14 cm2
S' Lateral: 3.3 cm

## 2021-04-09 MED ORDER — METOPROLOL TARTRATE 25 MG PO TABS: 25.0000 mg | ORAL_TABLET | ORAL | 3 refills | Status: DC | PRN
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
## 2021-04-09 NOTE — Patient Instructions (Signed)
Medication Instructions:  ?Your physician has recommended you make the following change in your medication: ?1.We are adding metoprolol tartrate (Lopressor) 25 mg, for you to take as needed for supraventricular tachycardia (SVT) ?*If you need a refill on your cardiac medications before your next appointment, please call your pharmacy* ? ? ?Lab Work: ?None ?If you have labs (blood work) drawn today and your tests are completely normal, you will receive your results only by: ?MyChart Message (if you have MyChart) OR ?A paper copy in the mail ?If you have any lab test that is abnormal or we need to change your treatment, we will call you to review the results. ? ?Follow-Up: ?At [facility], you and your health needs are our priority.  As part of our continuing mission to provide you with exceptional heart care, we have created designated Provider Care Teams.  These Care Teams include your primary Cardiologist (physician) and Advanced Practice Providers (APPs -  Physician Assistants and Nurse Practitioners) who all work together to provide you with the care you need, when you need it. ? ?Your next appointment:   ?1 year(s) ? ?The format for your next appointment:   ?In Person ? ?Provider:   ?Dr Candee Furbish ? ?Other Instructions ?1.Try to reduce your caffeine  intake ?

## 2021-04-12 ENCOUNTER — Other Ambulatory Visit: Payer: Self-pay

## 2021-04-12 MED ORDER — METOPROLOL TARTRATE 25 MG PO TABS: 25.0000 mg | ORAL_TABLET | Freq: Every day | ORAL | 3 refills | Status: AC | PRN

## 2021-05-10 ENCOUNTER — Ambulatory Visit (INDEPENDENT_AMBULATORY_CARE_PROVIDER_SITE_OTHER): Payer: BLUE CROSS/BLUE SHIELD | Admitting: Internal Medicine

## 2021-05-10 ENCOUNTER — Encounter: Payer: Self-pay | Admitting: Internal Medicine

## 2021-05-10 VITALS — BP 132/86 | HR 60 | Temp 97.7°F | Resp 16 | Ht 76.0 in | Wt 273.0 lb

## 2021-05-10 DIAGNOSIS — Z0000 Encounter for general adult medical examination without abnormal findings: Secondary | ICD-10-CM

## 2021-05-10 DIAGNOSIS — I10 Essential (primary) hypertension: Secondary | ICD-10-CM

## 2021-05-10 DIAGNOSIS — Z0001 Encounter for general adult medical examination with abnormal findings: Secondary | ICD-10-CM | POA: Insufficient documentation

## 2021-05-10 DIAGNOSIS — Z125 Encounter for screening for malignant neoplasm of prostate: Secondary | ICD-10-CM

## 2021-05-10 DIAGNOSIS — F5104 Psychophysiologic insomnia: Secondary | ICD-10-CM

## 2021-05-10 DIAGNOSIS — M170 Bilateral primary osteoarthritis of knee: Secondary | ICD-10-CM

## 2021-05-10 DIAGNOSIS — Z23 Encounter for immunization: Secondary | ICD-10-CM | POA: Diagnosis not present

## 2021-05-10 LAB — LIPID PANEL
Cholesterol: 158 mg/dL (ref 0–200)
HDL: 35.7 mg/dL — ABNORMAL LOW (ref >39.00)
LDL Cholesterol: 100 mg/dL — ABNORMAL HIGH (ref 0–99)
NonHDL: 122.27
Total CHOL/HDL Ratio: 4
Triglycerides: 110 mg/dL (ref 0.0–149.0)
VLDL: 22 mg/dL (ref 0.0–40.0)

## 2021-05-10 LAB — BASIC METABOLIC PANEL
BUN: 15 mg/dL (ref 6–23)
CO2: 29 mEq/L (ref 19–32)
Calcium: 8.9 mg/dL (ref 8.4–10.5)
Chloride: 104 mEq/L (ref 96–112)
Creatinine, Ser: 1.01 mg/dL (ref 0.40–1.50)
GFR: 81.21 mL/min (ref >60.00)
Glucose, Bld: 93 mg/dL (ref 70–99)
Potassium: 4.5 mEq/L (ref 3.5–5.1)
Sodium: 141 mEq/L (ref 135–145)

## 2021-05-10 LAB — CBC WITH DIFFERENTIAL/PLATELET
Basophils Absolute: 0.1 10*3/uL (ref 0.0–0.1)
Basophils Relative: 1 % (ref 0.0–3.0)
Eosinophils Absolute: 0.2 10*3/uL (ref 0.0–0.7)
Eosinophils Relative: 2.3 % (ref 0.0–5.0)
HCT: 41.6 % (ref 39.0–52.0)
Hemoglobin: 14.1 g/dL (ref 13.0–17.0)
Lymphocytes Relative: 28.1 % (ref 12.0–46.0)
Lymphs Abs: 1.9 10*3/uL (ref 0.7–4.0)
MCHC: 33.8 g/dL (ref 30.0–36.0)
MCV: 85.4 fl (ref 78.0–100.0)
Monocytes Absolute: 0.4 10*3/uL (ref 0.1–1.0)
Monocytes Relative: 6.1 % (ref 3.0–12.0)
Neutro Abs: 4.3 10*3/uL (ref 1.4–7.7)
Neutrophils Relative %: 62.5 % (ref 43.0–77.0)
Platelets: 225 10*3/uL (ref 150.0–400.0)
RBC: 4.87 Mil/uL (ref 4.22–5.81)
RDW: 14.2 % (ref 11.5–15.5)
WBC: 6.9 10*3/uL (ref 4.0–10.5)

## 2021-05-10 LAB — URINALYSIS, ROUTINE W REFLEX MICROSCOPIC
Bilirubin Urine: NEGATIVE
Hgb urine dipstick: NEGATIVE
Ketones, ur: NEGATIVE
Leukocytes,Ua: NEGATIVE
Nitrite: NEGATIVE
RBC / HPF: NONE SEEN (ref 0–?)
Specific Gravity, Urine: 1.025 (ref 1.000–1.030)
Total Protein, Urine: NEGATIVE
Urine Glucose: NEGATIVE
Urobilinogen, UA: 0.2 (ref 0.0–1.0)
pH: 6.5 (ref 5.0–8.0)

## 2021-05-10 LAB — PSA: PSA: 1.21 ng/mL (ref 0.10–4.00)

## 2021-05-10 MED ORDER — BELSOMRA 15 MG PO TABS: 15.0000 mg | ORAL_TABLET | Freq: Every evening | ORAL | 1 refills | Status: AC | PRN

## 2021-05-10 MED ORDER — TELMISARTAN 40 MG PO TABS: 40.0000 mg | ORAL_TABLET | Freq: Every day | ORAL | 1 refills | Status: DC

## 2021-05-10 NOTE — Progress Notes (Signed)
? ?Subjective:  ?Patient ID: Matthew Durie., male    DOB: 11-Feb-1960  Age: 61 y.o. MRN: [id number] ? ?CC: Annual Exam and Hypertension ? ? ?HPI ?Matthew Durie. presents for a CPX and f/up - ? ?He complains of insomnia with frequent awakenings.  He has not gotten much symptom relief with Benadryl.  He is active and denies chest pain, shortness of breath, or edema. ? ?Outpatient Medications Prior to Visit  ?Medication Sig Dispense Refill  ? metoprolol succinate (TOPROL-XL) 50 MG 24 hr tablet TAKE 1 TABLET(50 MG) BY MOUTH DAILY 90 tablet 1  ? metoprolol tartrate (LOPRESSOR) 25 MG tablet Take 1 tablet (25 mg total) by mouth daily as needed (for supraventricular tachycardia (SVT)). 90 tablet 3  ? meloxicam (MOBIC) 7.5 MG tablet TAKE 1 TABLET(7.5 MG) BY MOUTH DAILY 90 tablet 1  ? telmisartan (MICARDIS) 40 MG tablet TAKE 1 TABLET BY MOUTH DAILY 90 tablet 1  ? ?No facility-administered medications prior to visit.  ? ? ?ROS ?Review of Systems  ?Constitutional: Negative.  Negative for diaphoresis, fatigue and unexpected weight change.  ?HENT: Negative.    ?Eyes: Negative.   ?Respiratory:  Negative for cough, chest tightness, shortness of breath and wheezing.   ?Cardiovascular:  Negative for chest pain, palpitations and leg swelling.  ?Gastrointestinal:  Negative for abdominal pain, blood in stool, constipation, diarrhea, nausea and vomiting.  ?Endocrine: Negative.   ?Genitourinary: Negative.  Negative for difficulty urinating.  ?Musculoskeletal:  Positive for arthralgias. Negative for myalgias.  ?Skin: Negative.   ?Neurological: Negative.  Negative for dizziness, syncope, weakness, numbness and headaches.  ?Hematological:  Negative for adenopathy. Does not bruise/bleed easily.  ?Psychiatric/Behavioral:  Positive for sleep disturbance. Negative for confusion, decreased concentration, dysphoric mood and suicidal ideas. The patient is not nervous/anxious.   ? ?Objective:  ?BP 132/86 (BP Location: Right Arm, Patient  Position: Sitting, Cuff Size: Large)   Pulse 60   Temp 97.7 ?F (36.5 ?C) (Oral)   Resp 16   Ht '6\' 4"'$  (1.93 m)   Wt 273 lb (123.8 kg)   SpO2 94%   BMI 33.23 kg/m?  ? ?BP Readings from Last 3 Encounters:  ?05/10/21 132/86  ?04/09/21 118/78  ?08/31/20 138/86  ? ? ?Wt Readings from Last 3 Encounters:  ?05/10/21 273 lb (123.8 kg)  ?04/09/21 271 lb (122.9 kg)  ?08/31/20 271 lb (122.9 kg)  ? ? ?Physical Exam ?Vitals reviewed.  ?HENT:  ?   Nose: Nose normal.  ?   Mouth/Throat:  ?   Mouth: Mucous membranes are moist.  ?Eyes:  ?   General: No scleral icterus. ?   Conjunctiva/sclera: Conjunctivae normal.  ?Cardiovascular:  ?   Rate and Rhythm: Normal rate and regular rhythm.  ?   Heart sounds: S1 normal and S2 normal. Murmur heard.  ?Systolic murmur is present with a grade of 1/6.  ?No diastolic murmur is present.  ?  No friction rub. No gallop.  ?   Comments: 1/6 SEM RUSB ?Pulmonary:  ?   Effort: Pulmonary effort is normal.  ?   Breath sounds: No stridor. No wheezing, rhonchi or rales.  ?Abdominal:  ?   General: Abdomen is flat.  ?   Palpations: There is no mass.  ?   Tenderness: There is no abdominal tenderness. There is no guarding or rebound.  ?   Hernia: No hernia is present. There is no hernia in the left inguinal area or right inguinal area.  ?Genitourinary: ?   Pubic Area:  No rash.   ?   Penis: Normal and circumcised.   ?   Testes: Normal.  ?   Epididymis:  ?   Right: Normal.  ?   Left: Normal.  ?   Prostate: Normal. Not enlarged, not tender and no nodules present.  ?   Rectum: Normal. Guaiac result negative. No mass, tenderness, anal fissure, external hemorrhoid or internal hemorrhoid. Normal anal tone.  ?Musculoskeletal:     ?   General: Normal range of motion.  ?   Cervical back: Neck supple.  ?   Right lower leg: No edema.  ?   Left lower leg: No edema.  ?Lymphadenopathy:  ?   Cervical: No cervical adenopathy.  ?   Lower Body: No right inguinal adenopathy. No left inguinal adenopathy.  ?Skin: ?   General:  Skin is warm and dry.  ?   Coloration: Skin is not pale.  ?   Findings: No lesion.  ?Neurological:  ?   General: No focal deficit present.  ?   Mental Status: He is alert and oriented to person, place, and time. Mental status is at baseline.  ?Psychiatric:     ?   Mood and Affect: Mood normal.     ?   Behavior: Behavior normal.  ? ? ?Lab Results  ?Component Value Date  ? WBC 6.9 05/10/2021  ? HGB 14.1 05/10/2021  ? HCT 41.6 05/10/2021  ? PLT 225.0 05/10/2021  ? GLUCOSE 93 05/10/2021  ? CHOL 158 05/10/2021  ? TRIG 110.0 05/10/2021  ? HDL 35.70 (L) 05/10/2021  ? LDLCALC 100 (H) 05/10/2021  ? ALT 27 08/22/2020  ? AST 19 08/22/2020  ? NA 141 05/10/2021  ? K 4.5 05/10/2021  ? CL 104 05/10/2021  ? CREATININE 1.01 05/10/2021  ? BUN 15 05/10/2021  ? CO2 29 05/10/2021  ? TSH 1.43 02/06/2020  ? PSA 1.21 05/10/2021  ? HGBA1C 5.4 05/22/2017  ? ? ?DG Abd 1 View ? ?Result Date: 08/22/2020 ?CLINICAL DATA:  Confirm kidney stone.  Surgical consultation. EXAM: ABDOMEN - 1 VIEW COMPARISON:  CT scan August 21, 2020 FINDINGS: The known proximal right ureteral stone measuring 6 mm on recent CT imaging is seen in the right abdomen at the L2-3 level. The more proximal punctate stone is not clearly visualized. No other renal or ureteral stones noted. IMPRESSION: The known 6 mm proximal right ureteral stone is seen at the L2-3 level, just lateral to the transverse processes. The more proximal punctate stone seen on CT imaging is not clearly visualized. Electronically Signed   By: Dorise Bullion III M.D   On: 08/22/2020 15:06  ? ?DG C-Arm 1-60 Min-No Report ? ?Result Date: 08/22/2020 ?Fluoroscopy was utilized by the requesting physician.  No radiographic interpretation.  ? ? ?Assessment & Plan:  ? ?Matthew Grant was seen today for annual exam and hypertension. ? ?Diagnoses and all orders for this visit: ? ?Primary hypertension- His blood pressure is adequately well controlled. ?-     Basic metabolic panel; Future ?-     CBC with Differential/Platelet;  Future ?-     telmisartan (MICARDIS) 40 MG tablet; Take 1 tablet (40 mg total) by mouth daily. ?-     Urinalysis, Routine w reflex microscopic; Future ?-     Urinalysis, Routine w reflex microscopic ?-     CBC with Differential/Platelet ?-     Basic metabolic panel ? ?Encounter for general adult medical examination with abnormal findings- Exam completed, labs reviewed, vaccines reviewed and  updated, cancer screenings are up-to-date, patient education was given. ?-     Lipid panel; Future ?-     PSA; Future ?-     PSA ?-     Lipid panel ? ?Essential hypertension, benign ? ?Psychophysiological insomnia ?-     Suvorexant (BELSOMRA) 15 MG TABS; Take 15 mg by mouth at bedtime as needed. ? ?Primary osteoarthritis of both knees ?-     meloxicam (MOBIC) 7.5 MG tablet; TAKE 1 TABLET(7.5 MG) BY MOUTH DAILY ? ?Other orders ?-     Varicella-zoster vaccine IM (Shingrix) ? ? ?I have changed Matthew Kalata. Dorice Lamas. "Matthew Grant"'s telmisartan. I am also having him start on Belsomra. Additionally, I am having him maintain his metoprolol succinate, metoprolol tartrate, and meloxicam. ? ?Meds ordered this encounter  ?Medications  ? telmisartan (MICARDIS) 40 MG tablet  ?  Sig: Take 1 tablet (40 mg total) by mouth daily.  ?  Dispense:  90 tablet  ?  Refill:  1  ? Suvorexant (BELSOMRA) 15 MG TABS  ?  Sig: Take 15 mg by mouth at bedtime as needed.  ?  Dispense:  90 tablet  ?  Refill:  1  ? meloxicam (MOBIC) 7.5 MG tablet  ?  Sig: TAKE 1 TABLET(7.5 MG) BY MOUTH DAILY  ?  Dispense:  90 tablet  ?  Refill:  1  ? ? ? ?Follow-up: Return in about 6 months (around 11/09/2021). ? ?Scarlette Calico, MD ?

## 2021-05-10 NOTE — Patient Instructions (Signed)
Health Maintenance, Male Adopting a healthy lifestyle and getting preventive care are important in promoting health and wellness. Ask your health care provider about: The right schedule for you to have regular tests and exams. Things you can do on your own to prevent diseases and keep yourself healthy. What should I know about diet, weight, and exercise? Eat a healthy diet  Eat a diet that includes plenty of vegetables, fruits, low-fat dairy products, and lean protein. Do not eat a lot of foods that are high in solid fats, added sugars, or sodium. Maintain a healthy weight Body mass index (BMI) is a measurement that can be used to identify possible weight problems. It estimates body fat based on height and weight. Your health care provider can help determine your BMI and help you achieve or maintain a healthy weight. Get regular exercise Get regular exercise. This is one of the most important things you can do for your health. Most adults should: Exercise for at least 150 minutes each week. The exercise should increase your heart rate and make you sweat (moderate-intensity exercise). Do strengthening exercises at least twice a week. This is in addition to the moderate-intensity exercise. Spend less time sitting. Even light physical activity can be beneficial. Watch cholesterol and blood lipids Have your blood tested for lipids and cholesterol at 60 years of age, then have this test every 5 years. You may need to have your cholesterol levels checked more often if: Your lipid or cholesterol levels are high. You are older than 60 years of age. You are at high risk for heart disease. What should I know about cancer screening? Many types of cancers can be detected early and may often be prevented. Depending on your health history and family history, you may need to have cancer screening at various ages. This may include screening for: Colorectal cancer. Prostate cancer. Skin cancer. Lung  cancer. What should I know about heart disease, diabetes, and high blood pressure? Blood pressure and heart disease High blood pressure causes heart disease and increases the risk of stroke. This is more likely to develop in people who have high blood pressure readings or are overweight. Talk with your health care provider about your target blood pressure readings. Have your blood pressure checked: Every 3-5 years if you are 18-39 years of age. Every year if you are 40 years old or older. If you are between the ages of 65 and 75 and are a current or former smoker, ask your health care provider if you should have a one-time screening for abdominal aortic aneurysm (AAA). Diabetes Have regular diabetes screenings. This checks your fasting blood sugar level. Have the screening done: Once every three years after age 45 if you are at a normal weight and have a low risk for diabetes. More often and at a younger age if you are overweight or have a high risk for diabetes. What should I know about preventing infection? Hepatitis B If you have a higher risk for hepatitis B, you should be screened for this virus. Talk with your health care provider to find out if you are at risk for hepatitis B infection. Hepatitis C Blood testing is recommended for: Everyone born from 1945 through 1965. Anyone with known risk factors for hepatitis C. Sexually transmitted infections (STIs) You should be screened each year for STIs, including gonorrhea and chlamydia, if: You are sexually active and are younger than 60 years of age. You are older than 60 years of age and your   health care provider tells you that you are at risk for this type of infection. Your sexual activity has changed since you were last screened, and you are at increased risk for chlamydia or gonorrhea. Ask your health care provider if you are at risk. Ask your health care provider about whether you are at high risk for HIV. Your health care provider  may recommend a prescription medicine to help prevent HIV infection. If you choose to take medicine to prevent HIV, you should first get tested for HIV. You should then be tested every 3 months for as long as you are taking the medicine. Follow these instructions at home: Alcohol use Do not drink alcohol if your health care provider tells you not to drink. If you drink alcohol: Limit how much you have to 0-2 drinks a day. Know how much alcohol is in your drink. In the [place] one drink equals one 12 oz bottle of beer (355 mL), one 5 oz glass of wine (148 mL), or one 1 oz glass of hard liquor (44 mL). Lifestyle Do not use any products that contain nicotine or tobacco. These products include cigarettes, chewing tobacco, and vaping devices, such as e-cigarettes. If you need help quitting, ask your health care provider. Do not use street drugs. Do not share needles. Ask your health care provider for help if you need support or information about quitting drugs. General instructions Schedule regular health, dental, and eye exams. Stay current with your vaccines. Tell your health care provider if: You often feel depressed. You have ever been abused or do not feel safe at home. Summary Adopting a healthy lifestyle and getting preventive care are important in promoting health and wellness. Follow your health care provider's instructions about healthy diet, exercising, and getting tested or screened for diseases. Follow your health care provider's instructions on monitoring your cholesterol and blood pressure. This information is not intended to replace advice given to you by your health care provider. Make sure you discuss any questions you have with your health care provider. Document Revised: 06/01/2020 Document Reviewed: 06/01/2020 Elsevier Patient Education  2023 [employer]  

## 2021-05-13 ENCOUNTER — Other Ambulatory Visit: Payer: Self-pay | Admitting: Internal Medicine

## 2021-05-13 ENCOUNTER — Encounter: Payer: Self-pay | Admitting: Internal Medicine

## 2021-05-13 MED ORDER — MELOXICAM 7.5 MG PO TABS: ORAL_TABLET | ORAL | 1 refills | Status: DC

## 2021-06-15 DIAGNOSIS — M79671 Pain in right foot: Secondary | ICD-10-CM | POA: Diagnosis not present

## 2021-06-15 DIAGNOSIS — M67871 Other specified disorders of synovium, right ankle and foot: Secondary | ICD-10-CM | POA: Diagnosis not present

## 2021-06-30 DIAGNOSIS — R109 Unspecified abdominal pain: Secondary | ICD-10-CM | POA: Diagnosis not present

## 2021-06-30 DIAGNOSIS — N209 Urinary calculus, unspecified: Secondary | ICD-10-CM | POA: Diagnosis not present

## 2021-06-30 DIAGNOSIS — N210 Calculus in bladder: Secondary | ICD-10-CM | POA: Diagnosis not present

## 2021-06-30 DIAGNOSIS — M549 Dorsalgia, unspecified: Secondary | ICD-10-CM | POA: Diagnosis not present

## 2021-06-30 DIAGNOSIS — N2889 Other specified disorders of kidney and ureter: Secondary | ICD-10-CM | POA: Diagnosis not present

## 2021-06-30 DIAGNOSIS — K7689 Other specified diseases of liver: Secondary | ICD-10-CM | POA: Diagnosis not present

## 2021-06-30 DIAGNOSIS — R319 Hematuria, unspecified: Secondary | ICD-10-CM | POA: Diagnosis not present

## 2021-06-30 DIAGNOSIS — K824 Cholesterolosis of gallbladder: Secondary | ICD-10-CM | POA: Diagnosis not present

## 2021-06-30 DIAGNOSIS — R110 Nausea: Secondary | ICD-10-CM | POA: Diagnosis not present

## 2021-07-01 ENCOUNTER — Encounter: Payer: Self-pay | Admitting: Internal Medicine

## 2021-09-20 ENCOUNTER — Other Ambulatory Visit: Payer: Self-pay | Admitting: Internal Medicine

## 2021-09-20 DIAGNOSIS — M170 Bilateral primary osteoarthritis of knee: Secondary | ICD-10-CM

## 2021-10-04 IMAGING — CR DG CHEST 2V
2 series · 2 of 2 positions shown · non-contrast
Comparison: 03/23/2016

CLINICAL DATA: Chest pain

EXAM:
CHEST - 2 VIEW

[chest pa]
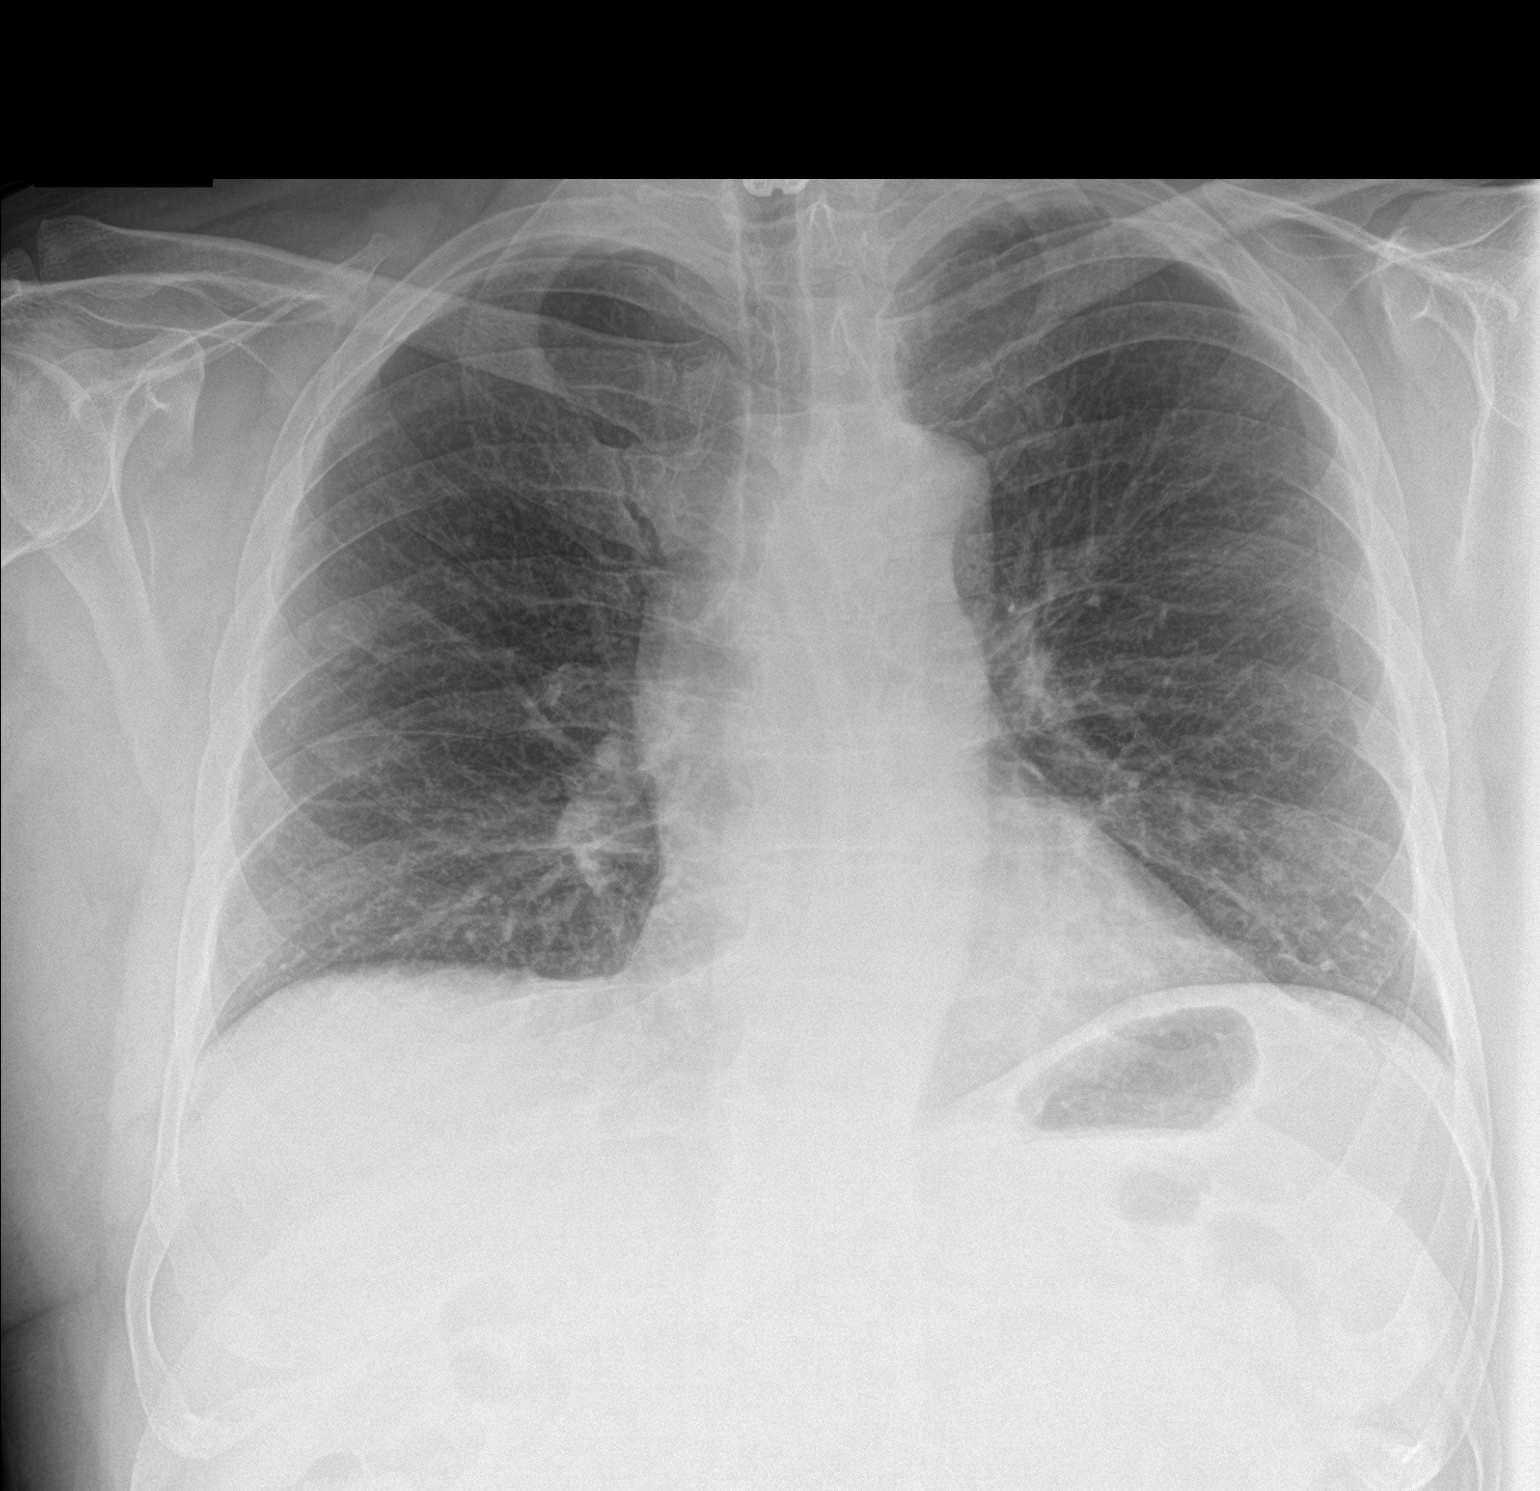

[chest lat]
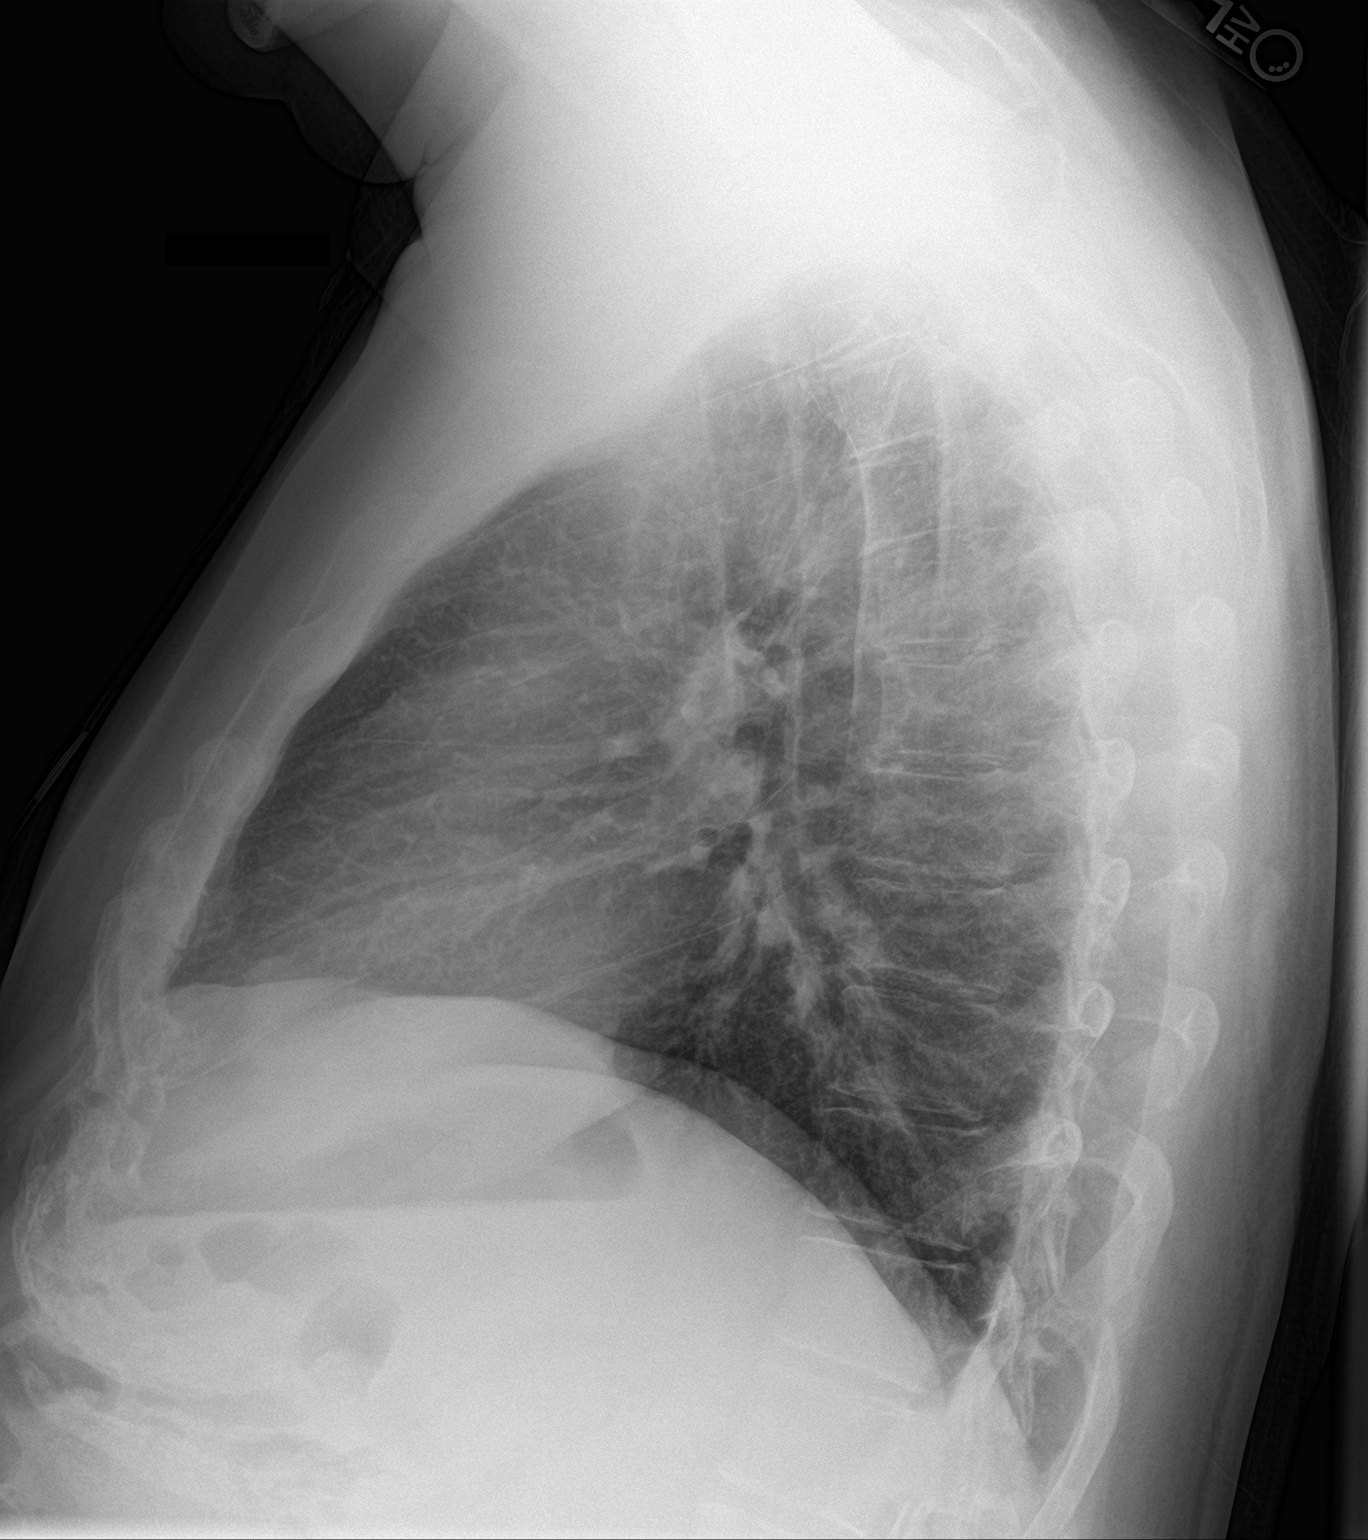

[2 of 2 positions shown; findings below may reference images not displayed]

FINDINGS: Heart and mediastinal contours are within normal limits. No focal
opacities or effusions. No acute bony abnormality.
IMPRESSION: No active cardiopulmonary disease.

## 2021-11-12 DIAGNOSIS — M79645 Pain in left finger(s): Secondary | ICD-10-CM | POA: Diagnosis not present

## 2021-11-12 DIAGNOSIS — M25561 Pain in right knee: Secondary | ICD-10-CM | POA: Diagnosis not present

## 2021-11-12 DIAGNOSIS — M1711 Unilateral primary osteoarthritis, right knee: Secondary | ICD-10-CM | POA: Diagnosis not present

## 2021-11-15 ENCOUNTER — Ambulatory Visit: Payer: BLUE CROSS/BLUE SHIELD | Admitting: Internal Medicine

## 2021-12-21 ENCOUNTER — Other Ambulatory Visit: Payer: Self-pay | Admitting: Internal Medicine

## 2021-12-21 ENCOUNTER — Other Ambulatory Visit: Payer: Self-pay | Admitting: Cardiology

## 2021-12-21 DIAGNOSIS — I10 Essential (primary) hypertension: Secondary | ICD-10-CM

## 2022-01-31 DIAGNOSIS — R051 Acute cough: Secondary | ICD-10-CM | POA: Diagnosis not present

## 2022-01-31 DIAGNOSIS — J3489 Other specified disorders of nose and nasal sinuses: Secondary | ICD-10-CM | POA: Diagnosis not present

## 2022-03-31 ENCOUNTER — Other Ambulatory Visit: Payer: Self-pay | Admitting: Internal Medicine

## 2022-03-31 DIAGNOSIS — I10 Essential (primary) hypertension: Secondary | ICD-10-CM

## 2022-04-01 ENCOUNTER — Other Ambulatory Visit: Payer: Self-pay | Admitting: Cardiology

## 2022-04-01 ENCOUNTER — Other Ambulatory Visit: Payer: Self-pay | Admitting: Internal Medicine

## 2022-04-11 ENCOUNTER — Encounter: Payer: Self-pay | Admitting: Internal Medicine

## 2022-04-11 ENCOUNTER — Other Ambulatory Visit: Payer: Self-pay | Admitting: Internal Medicine

## 2022-04-11 ENCOUNTER — Other Ambulatory Visit: Payer: Self-pay | Admitting: Cardiology

## 2022-04-11 DIAGNOSIS — I10 Essential (primary) hypertension: Secondary | ICD-10-CM

## 2022-04-11 MED ORDER — TELMISARTAN 40 MG PO TABS: 40.0000 mg | ORAL_TABLET | Freq: Every day | ORAL | 0 refills | Status: AC

## 2022-04-14 ENCOUNTER — Other Ambulatory Visit: Payer: Self-pay

## 2022-04-14 NOTE — Telephone Encounter (Signed)
Pt's wife calling requesting a refill for 30 day for medication metoprolol until pt is seen by new doctor on 05/11/22. Would Dr. Marlou Porch like to refill this medication for pt until appt.? Please address

## 2022-04-15 MED ORDER — METOPROLOL SUCCINATE ER 50 MG PO TB24: ORAL_TABLET | ORAL | 0 refills | Status: AC

## 2022-04-26 ENCOUNTER — Other Ambulatory Visit: Payer: Self-pay | Admitting: Cardiology

## 2022-05-11 DIAGNOSIS — Z1330 Encounter for screening examination for mental health and behavioral disorders, unspecified: Secondary | ICD-10-CM | POA: Diagnosis not present

## 2022-05-11 DIAGNOSIS — M170 Bilateral primary osteoarthritis of knee: Secondary | ICD-10-CM | POA: Diagnosis not present

## 2022-05-11 DIAGNOSIS — Z79899 Other long term (current) drug therapy: Secondary | ICD-10-CM | POA: Diagnosis not present

## 2022-05-11 DIAGNOSIS — I471 Supraventricular tachycardia, unspecified: Secondary | ICD-10-CM | POA: Diagnosis not present

## 2022-05-11 DIAGNOSIS — Z125 Encounter for screening for malignant neoplasm of prostate: Secondary | ICD-10-CM | POA: Diagnosis not present

## 2022-05-11 DIAGNOSIS — Z7689 Persons encountering health services in other specified circumstances: Secondary | ICD-10-CM | POA: Diagnosis not present

## 2022-05-11 DIAGNOSIS — I77810 Thoracic aortic ectasia: Secondary | ICD-10-CM | POA: Diagnosis not present

## 2022-05-11 DIAGNOSIS — I10 Essential (primary) hypertension: Secondary | ICD-10-CM | POA: Diagnosis not present

## 2022-06-15 DIAGNOSIS — I471 Supraventricular tachycardia, unspecified: Secondary | ICD-10-CM | POA: Diagnosis not present

## 2022-06-15 DIAGNOSIS — I77810 Thoracic aortic ectasia: Secondary | ICD-10-CM | POA: Diagnosis not present

## 2022-06-16 DIAGNOSIS — G4733 Obstructive sleep apnea (adult) (pediatric): Secondary | ICD-10-CM | POA: Diagnosis not present

## 2022-06-16 DIAGNOSIS — G4730 Sleep apnea, unspecified: Secondary | ICD-10-CM | POA: Diagnosis not present

## 2022-06-26 DIAGNOSIS — I10 Essential (primary) hypertension: Secondary | ICD-10-CM | POA: Diagnosis not present

## 2022-06-26 DIAGNOSIS — R0789 Other chest pain: Secondary | ICD-10-CM | POA: Diagnosis not present

## 2022-10-22 DIAGNOSIS — R1032 Left lower quadrant pain: Secondary | ICD-10-CM | POA: Diagnosis not present

## 2022-10-22 DIAGNOSIS — N201 Calculus of ureter: Secondary | ICD-10-CM | POA: Diagnosis not present

## 2022-10-22 DIAGNOSIS — I10 Essential (primary) hypertension: Secondary | ICD-10-CM | POA: Diagnosis not present

## 2022-10-22 DIAGNOSIS — R109 Unspecified abdominal pain: Secondary | ICD-10-CM | POA: Diagnosis not present

## 2022-10-22 DIAGNOSIS — Z87442 Personal history of urinary calculi: Secondary | ICD-10-CM | POA: Diagnosis not present

## 2022-10-22 DIAGNOSIS — R112 Nausea with vomiting, unspecified: Secondary | ICD-10-CM | POA: Diagnosis not present

## 2022-10-22 DIAGNOSIS — N200 Calculus of kidney: Secondary | ICD-10-CM | POA: Diagnosis not present

## 2022-10-30 DIAGNOSIS — N132 Hydronephrosis with renal and ureteral calculous obstruction: Secondary | ICD-10-CM | POA: Diagnosis not present

## 2022-10-30 DIAGNOSIS — R109 Unspecified abdominal pain: Secondary | ICD-10-CM | POA: Diagnosis not present

## 2022-10-30 DIAGNOSIS — I10 Essential (primary) hypertension: Secondary | ICD-10-CM | POA: Diagnosis not present

## 2022-10-30 DIAGNOSIS — Z87442 Personal history of urinary calculi: Secondary | ICD-10-CM | POA: Diagnosis not present

## 2022-10-31 DIAGNOSIS — R112 Nausea with vomiting, unspecified: Secondary | ICD-10-CM | POA: Diagnosis not present

## 2022-10-31 DIAGNOSIS — R109 Unspecified abdominal pain: Secondary | ICD-10-CM | POA: Diagnosis not present

## 2022-10-31 DIAGNOSIS — N201 Calculus of ureter: Secondary | ICD-10-CM | POA: Diagnosis not present

## 2022-10-31 DIAGNOSIS — I10 Essential (primary) hypertension: Secondary | ICD-10-CM | POA: Diagnosis not present

## 2022-11-01 DIAGNOSIS — Z91041 Radiographic dye allergy status: Secondary | ICD-10-CM | POA: Diagnosis not present

## 2022-11-01 DIAGNOSIS — N201 Calculus of ureter: Secondary | ICD-10-CM | POA: Diagnosis not present

## 2022-11-01 DIAGNOSIS — I10 Essential (primary) hypertension: Secondary | ICD-10-CM | POA: Diagnosis not present

## 2022-11-01 DIAGNOSIS — I471 Supraventricular tachycardia, unspecified: Secondary | ICD-10-CM | POA: Diagnosis not present

## 2022-11-01 DIAGNOSIS — N202 Calculus of kidney with calculus of ureter: Secondary | ICD-10-CM | POA: Diagnosis not present

## 2022-11-01 DIAGNOSIS — Z791 Long term (current) use of non-steroidal anti-inflammatories (NSAID): Secondary | ICD-10-CM | POA: Diagnosis not present

## 2022-11-01 DIAGNOSIS — Z79899 Other long term (current) drug therapy: Secondary | ICD-10-CM | POA: Diagnosis not present

## 2022-12-06 DIAGNOSIS — Z0000 Encounter for general adult medical examination without abnormal findings: Secondary | ICD-10-CM | POA: Diagnosis not present

## 2022-12-06 DIAGNOSIS — Z79899 Other long term (current) drug therapy: Secondary | ICD-10-CM | POA: Diagnosis not present

## 2022-12-06 DIAGNOSIS — I350 Nonrheumatic aortic (valve) stenosis: Secondary | ICD-10-CM | POA: Diagnosis not present

## 2022-12-06 DIAGNOSIS — Z23 Encounter for immunization: Secondary | ICD-10-CM | POA: Diagnosis not present

## 2022-12-06 DIAGNOSIS — I10 Essential (primary) hypertension: Secondary | ICD-10-CM | POA: Diagnosis not present

## 2022-12-06 DIAGNOSIS — I471 Supraventricular tachycardia, unspecified: Secondary | ICD-10-CM | POA: Diagnosis not present

## 2022-12-06 DIAGNOSIS — I77810 Thoracic aortic ectasia: Secondary | ICD-10-CM | POA: Diagnosis not present

## 2022-12-12 DIAGNOSIS — N281 Cyst of kidney, acquired: Secondary | ICD-10-CM | POA: Diagnosis not present

## 2022-12-12 DIAGNOSIS — N201 Calculus of ureter: Secondary | ICD-10-CM | POA: Diagnosis not present

## 2022-12-12 DIAGNOSIS — N200 Calculus of kidney: Secondary | ICD-10-CM | POA: Diagnosis not present

## 2022-12-13 DIAGNOSIS — N200 Calculus of kidney: Secondary | ICD-10-CM | POA: Diagnosis not present

## 2022-12-13 DIAGNOSIS — I10 Essential (primary) hypertension: Secondary | ICD-10-CM | POA: Diagnosis not present

## 2022-12-13 DIAGNOSIS — N201 Calculus of ureter: Secondary | ICD-10-CM | POA: Diagnosis not present

## 2022-12-16 DIAGNOSIS — I471 Supraventricular tachycardia, unspecified: Secondary | ICD-10-CM | POA: Diagnosis not present

## 2023-05-16 IMAGING — DX DG ABDOMEN 1V
2 series · 2 of 2 positions shown · non-contrast
Comparison: CT scan August 21, 2020

CLINICAL DATA: Confirm kidney stone.  Surgical consultation.

EXAM:
ABDOMEN - 1 VIEW

[abdomen kub (1 of 2)]
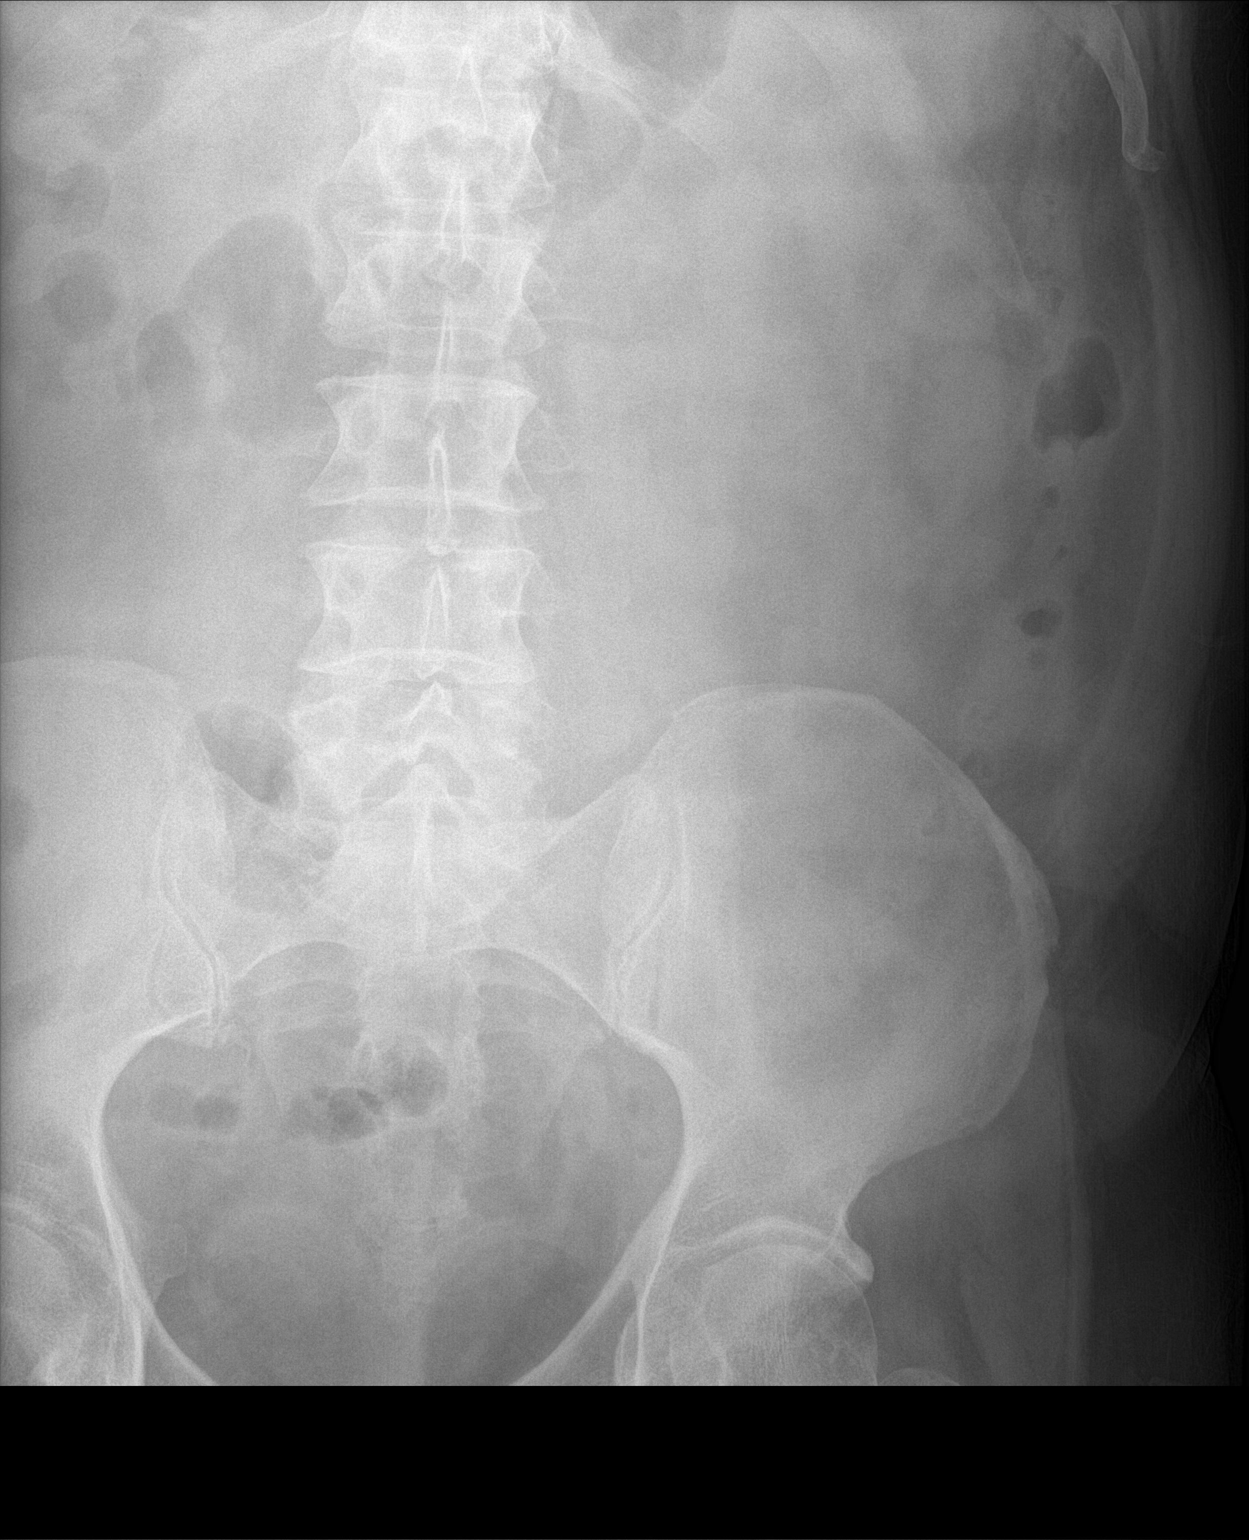

[abdomen kub (2 of 2)]
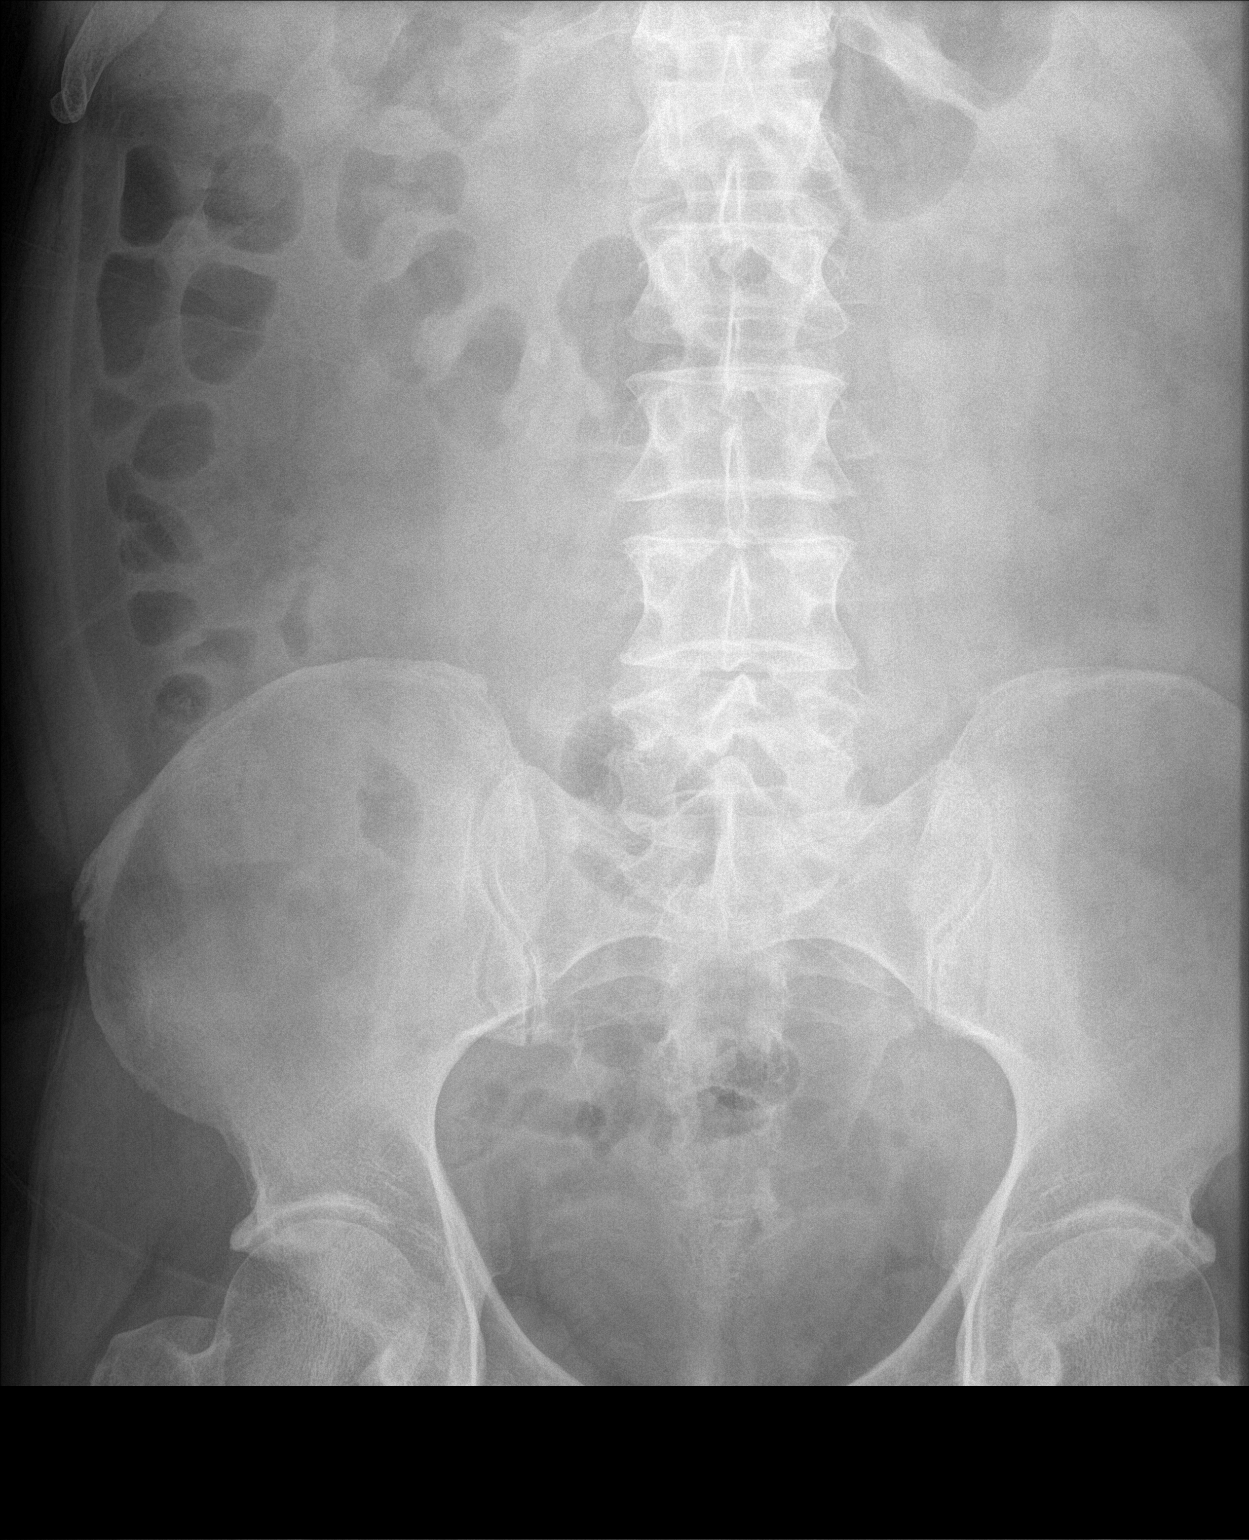

[2 of 2 positions shown; findings below may reference images not displayed]

FINDINGS: The known proximal right ureteral stone measuring 6 mm on recent CT
imaging is seen in the right abdomen at the L2-3 level. The more
proximal punctate stone is not clearly visualized. No other renal or
ureteral stones noted.
IMPRESSION: The known 6 mm proximal right ureteral stone is seen at the L2-3
level, just lateral to the transverse processes. The more proximal
punctate stone seen on CT imaging is not clearly visualized.
